# Patient Record
Sex: Female | Born: 1975
Health system: Southern US, Community
[De-identification: ages and names within clinical notes are randomized; demographics above are authoritative.]

## PROBLEM LIST (undated history)

## (undated) ENCOUNTER — Ambulatory Visit: Admission: EM

## (undated) DIAGNOSIS — N736 Female pelvic peritoneal adhesions (postinfective): Secondary | ICD-10-CM

## (undated) DIAGNOSIS — N7011 Chronic salpingitis: Secondary | ICD-10-CM

## (undated) DIAGNOSIS — K754 Autoimmune hepatitis: Secondary | ICD-10-CM

## (undated) DIAGNOSIS — Z8639 Personal history of other endocrine, nutritional and metabolic disease: Secondary | ICD-10-CM

## (undated) DIAGNOSIS — Z8679 Personal history of other diseases of the circulatory system: Secondary | ICD-10-CM

## (undated) DIAGNOSIS — I10 Essential (primary) hypertension: Secondary | ICD-10-CM

## (undated) DIAGNOSIS — N979 Female infertility, unspecified: Secondary | ICD-10-CM

## (undated) DIAGNOSIS — J3089 Other allergic rhinitis: Secondary | ICD-10-CM

## (undated) DIAGNOSIS — H1010 Acute atopic conjunctivitis, unspecified eye: Secondary | ICD-10-CM

## (undated) DIAGNOSIS — J302 Other seasonal allergic rhinitis: Secondary | ICD-10-CM

## (undated) HISTORY — PX: LIVER BIOPSY: SHX301

---

## 2005-06-04 ENCOUNTER — Emergency Department (HOSPITAL_COMMUNITY): Admission: EM | Admit: 2005-06-04 | Discharge: 2005-06-04 | Payer: Self-pay | Admitting: Emergency Medicine

## 2005-06-09 ENCOUNTER — Inpatient Hospital Stay (HOSPITAL_COMMUNITY): Admission: EM | Admit: 2005-06-09 | Discharge: 2005-06-21 | Payer: Self-pay | Admitting: Emergency Medicine

## 2005-06-14 ENCOUNTER — Encounter: Payer: Self-pay | Admitting: Internal Medicine

## 2005-06-14 ENCOUNTER — Encounter (INDEPENDENT_AMBULATORY_CARE_PROVIDER_SITE_OTHER): Payer: Self-pay | Admitting: Specialist

## 2005-07-06 ENCOUNTER — Ambulatory Visit: Payer: Self-pay | Admitting: Family Medicine

## 2005-10-26 ENCOUNTER — Emergency Department (HOSPITAL_COMMUNITY): Admission: EM | Admit: 2005-10-26 | Discharge: 2005-10-26 | Payer: Self-pay | Admitting: Family Medicine

## 2006-08-22 ENCOUNTER — Ambulatory Visit (HOSPITAL_COMMUNITY): Admission: RE | Admit: 2006-08-22 | Discharge: 2006-08-22 | Payer: Self-pay | Admitting: Gastroenterology

## 2006-08-22 ENCOUNTER — Encounter (INDEPENDENT_AMBULATORY_CARE_PROVIDER_SITE_OTHER): Payer: Self-pay | Admitting: Interventional Radiology

## 2009-01-08 HISTORY — PX: ESOPHAGOGASTRODUODENOSCOPY: SHX1529

## 2009-01-14 ENCOUNTER — Encounter: Admission: RE | Admit: 2009-01-14 | Discharge: 2009-01-14 | Payer: Self-pay | Admitting: Gastroenterology

## 2009-01-15 ENCOUNTER — Encounter (HOSPITAL_COMMUNITY): Admission: RE | Admit: 2009-01-15 | Discharge: 2009-02-07 | Payer: Self-pay | Admitting: Gastroenterology

## 2009-05-20 ENCOUNTER — Ambulatory Visit (HOSPITAL_COMMUNITY): Admission: RE | Admit: 2009-05-20 | Discharge: 2009-05-20 | Payer: Self-pay | Admitting: Internal Medicine

## 2009-05-20 HISTORY — PX: OTHER SURGICAL HISTORY: SHX169

## 2010-03-18 ENCOUNTER — Other Ambulatory Visit: Payer: Self-pay | Admitting: Obstetrics & Gynecology

## 2010-04-29 LAB — COMPREHENSIVE METABOLIC PANEL
CO2: 28 mEq/L (ref 19–32)
Calcium: 9.2 mg/dL (ref 8.4–10.5)
Creatinine, Ser: 0.71 mg/dL (ref 0.4–1.2)
Glucose, Bld: 78 mg/dL (ref 70–99)
Potassium: 4 mEq/L (ref 3.5–5.1)
Total Protein: 7.4 g/dL (ref 6.0–8.3)

## 2010-04-29 LAB — CBC
HCT: 38.3 % (ref 36.0–46.0)
Hemoglobin: 12 g/dL (ref 12.0–15.0)
MCHC: 31.4 g/dL (ref 30.0–36.0)
MCV: 69.8 fL — ABNORMAL LOW (ref 78.0–100.0)
Platelets: 439 10*3/uL — ABNORMAL HIGH (ref 150–400)
RBC: 5.49 MIL/uL — ABNORMAL HIGH (ref 3.87–5.11)
RDW: 18.2 % — ABNORMAL HIGH (ref 11.5–15.5)

## 2010-04-29 LAB — PREGNANCY, URINE: Preg Test, Ur: NEGATIVE

## 2010-05-12 LAB — CBC
MCV: 58.1 fL — ABNORMAL LOW (ref 78.0–100.0)
Platelets: 432 10*3/uL — ABNORMAL HIGH (ref 150–400)
RBC: 4.94 MIL/uL (ref 3.87–5.11)

## 2010-05-12 LAB — CROSSMATCH: ABO/RH(D): O POS

## 2010-05-12 LAB — ABO/RH: ABO/RH(D): O POS

## 2010-05-28 ENCOUNTER — Other Ambulatory Visit (HOSPITAL_COMMUNITY): Payer: Self-pay | Admitting: Obstetrics & Gynecology

## 2010-05-28 DIAGNOSIS — N979 Female infertility, unspecified: Secondary | ICD-10-CM

## 2010-06-05 ENCOUNTER — Ambulatory Visit (HOSPITAL_COMMUNITY): Payer: 59

## 2010-06-19 ENCOUNTER — Other Ambulatory Visit: Payer: Self-pay | Admitting: Gastroenterology

## 2010-06-19 DIAGNOSIS — R110 Nausea: Secondary | ICD-10-CM

## 2010-06-24 ENCOUNTER — Ambulatory Visit
Admission: RE | Admit: 2010-06-24 | Discharge: 2010-06-24 | Disposition: A | Discharge status: Home / Self Care | Payer: 59 | Source: Ambulatory Visit | Attending: Gastroenterology | Admitting: Gastroenterology

## 2010-06-24 DIAGNOSIS — R110 Nausea: Secondary | ICD-10-CM

## 2010-06-26 NOTE — Discharge Summary (Signed)
Katie Andrade, Katie Andrade               ACCOUNT NO.:  [id number]   MEDICAL RECORD NO.:  [id number]          PATIENT TYPE:  INP   LOCATION:  1430                         FACILITY:  [facility]   PHYSICIAN:  Hollice Espy, M.D.DATE OF BIRTH:  Nov 02, 1975   DATE OF ADMISSION:  06/09/2005  DATE OF DISCHARGE:  06/14/2005                                 DISCHARGE SUMMARY   PRIMARY CARE PHYSICIAN:  None.   CONSULTATIONS:  Bernette Redbird, M.D., [facility].   DISCHARGE DIAGNOSES:  1.  Acute liver failure, etiology unknown and workup in progress.  2.  History of Hashimoto's thyroiditis and now secondary hypothyroidism.  3.  Obesity.   DISCHARGE MEDICATIONS:  The patient is not on any outpatient medications at  the time of admission.  Currently, she is on:  1.  Dulcolax 5 mg p.o. daily p.r.n.  2.  2 mg IV Zofran q.6 hours p.r.n. adjusted for her hepatic failure.  3.  Ambien 2.5 mg p.o. q.h.s. p.r.n.   HOSPITAL COURSE:  The patient is a very pleasant 35 year old African-  American female with past medical history only of Hashimoto's' thyroiditis  in the past as well as obesity who presented to the emergency room with  several days of nausea and vomiting.  In addition, she was noted at time of  admission that her sclerae were icteric.  She had no other symptoms and has  never once had any episodes of abdominal pain.  On admission, the patient  had lab work done which noted a UA with a large amount of bilirubin, trace  ketones, essentially normal BMET with a slightly low potassium accounting  for her vomiting, a normal white count with no shift, but most concerning  was her hepatic function panel which noted an AST of 4154, ALT 3042, and a  total bilirubin of 16.2.  The patient was admitted for hepatic failure and  planned for workup.  The patient was also noted to have a mild UTI and she  received 3 days of IV Cipro which she has completed.  The patient had a  hepatitis panel ordered  which showed negative for hepatitis B surface  antigen and core antigen both of which were negative as well as negative  antibiotics with hepatitis C and hepatitis A IgM.  On hospital day #2, the  patient had an INR checked and found to be elevated at 1.6.  One was not  done on admission, however, concerning were her liver function tests which  showed her bilirubin had increased to 13.7, AST and ALT however had  decreased.  At this point, [facility], Dr. Matthias Hughs was consulted  to evaluate the patient for cause of this hepatic failure.  Serum  ceruloplasmin to rule out Wilson's disease as well as iron studies to rule  out hemachromatosis were ordered as well.  Dr. Matthias Hughs met with the patient  and felt the patient had acute hepatitis of unclear cause.  He felt this  possibly could be infectious etiology, although, known pathogens were  screened for and were negative.  Toxic ingestion although there was no  history.  The patient had a Tylenol level as well as urine drug screen  ordered both of which were negative.  The patient is an excellent historian  and gave no history of any recent Tylenol use.  The patient's ceruloplasmin,  although, came back negative consistent with Wilson's disease.  She had her  ferritin level which returned back elevated at 1100, but with normal serum  iron and TIBC seen to rule out hemachromatosis as well.  In addition, the  patient's total protein and albumin levels were within normal range at 5.5  and 2.5 respectively ruling out any type of significant  hypergammaglobulinemia.  Dr. Matthias Hughs felt that with the iron and iron  binding capacity tests being negative, the patient's ferritin level may be  increased secondary to her acute presentation and not necessarily from iron  deposition.  Dr. Matthias Hughs discussed the patient with Dr. Piedad Climes, a  hepatologist at [facility] in terms of a discussion about possible liver  transplant and transfer to [facility].  Dr. Piedad Climes felt they would be  happy to take the patient in transfer, although, they felt at this time the  patient was stable.  She was alert and oriented.  She showed no signs of  hepatic encephalopathy and recommended continuing to follow the patient to  ensure that her liver function tests continued to improve.  In addition,  recommending checking an ANA, smooth muscle antibody, and serum IgG.  Over  the next several days, the patient was continued to be followed.  Her  transaminases continued to trend down.  Attempts were made for a CT of the  abdomen and pelvis.  The patient had a right upper quadrant ultrasound which  was completely unremarkable on admission.  She also had been complaining  initially of some mild dysphagia which was eventually felt to be just  secondary to some gagging from her initial nausea and vomiting.  A barium  swallow evaluation was done on hospital day #2 which was completely normal.  However, the patient had some difficulty moving her bowels and her barium  was retained which prevented a full CT of the abdomen and pelvis study.  She  was finally able to clear this barium and had the CT of the abdomen and  pelvis done on the afternoon of Jun 13, 2005.  This showed a completely  unremarkable liver as well as pancreas, gallbladder, adrenal, and kidneys.  The spleen was noted to be on the upper limits of normal without any focal  splenic lesion.  No evidence of any biliary dilatation, free fluid, or  abdominal aortic aneurysm was noted as well.  The patient was noted to have  some few mildly enlarged mesenteric lymph nodes which were more likely felt  to be reactive than anything else.  A CT of her pelvis was completely  unremarkable.  The patient continued to have labs checked including a daily  liver function tests and PT/INR.  Her INR, however, has continued to trend up and is now 2.1.  Her transaminases initially were trending down, however,  on Jun 14, 2005, today her transaminases have slightly elevated to an AST of  2881, ALT of 1745, when compared to day prior which were 2331 and 1738.  Her  bilirubin has continued to trend up and is now 20.5.  An ammonia level was  checked and was found to be elevated at 37, minimal elevation, but compared  to one done on hospital day #2, is  slightly now within normal range.  In  addition, lipase level was checked initially and it was found to be negative  as well.  Shirley Friar, M.D., who works with Dr. Matthias Hughs for [facility]  [facility], followed the patient and recommended following a negative  CT scan, a liver biopsy.  In discussion with interventional radiology,  despite the elevated INR, the plans were by interventional radiology to  still do the liver biopsy given the worsening status of the patient's liver  and the need for diagnosis.  The patient is currently undergoing her liver  biopsy today, Jun 14, 2005, this morning.  Results are pending.  In addition,  given her worsening liver enzyme studies, the patient has been followed up  by GI and discussed at [facility].  [facility] has agreed to accept the  patient and the plan is for the patient to be transferred over to [facility] for further liver study workup and possibly for consideration for  transplantation.  At this time, the etiology of the patient's liver failure  is unknown, although, possibilities were fatty liver infiltration, however,  no significant fat infiltrates are seen on CT.  The other concern would be  given the patient's previous history of Hashimoto's thyroiditis, that this  is a possible autoimmune etiology.  At this time, smooth muscle antibody and  ANA are still pending.      Hollice Espy, M.D.  Electronically Signed     SKK/MEDQ  D:  06/14/2005  T:  06/14/2005  Job:  [id number]   cc:   Bernette Redbird, M.D.  Fax: [phone]

## 2010-06-26 NOTE — Discharge Summary (Signed)
Katie, Andrade NO.:  [id number]   MEDICAL RECORD NO.:  [id number]          PATIENT TYPE:  INP   LOCATION:  1402                         FACILITY:  [facility]   PHYSICIAN:  Deirdre Peer. Polite, M.D. DATE OF BIRTH:  Jan 21, 1976   DATE OF ADMISSION:  06/09/2005  DATE OF DISCHARGE:  06/21/2005                                 DISCHARGE SUMMARY   ADDENDUM   DISCHARGE DIAGNOSES:  1.  Hepatitis felt secondary to be autoimmune hepatitis with improving liver      function tests with AST 255, ALT 466 at time of discharge.      International normalized ratio 1.3 being treated with prednisone until      outpatient follow up with Dr. Shirley Friar, [facility]      [facility]astro[facility]ology.  2.  Steroid-induced hyperglycemia discharged on Amaryl 2 mg daily.  Surgical      pathology report showed a pattern of severe, chronic, active hepatitis      with necrosis most compatible with acute autoimmune hepatitis.   DISPOSITION:  Discharged to home.   FOLLOW UP:  Follow up with Dr. Shirley Friar, gastroenterology.   HISTORY OF PRESENT ILLNESS:  Please see admission H&P and discharge summary  by Dr. [name] and Dr. Rito Ehrlich.  In summary, the patient presented to the  hospital with jaundice and was admitted for further evaluation.  Please see  complete discharge summary by Dr. Rito Ehrlich for further details.   HOSPITAL COURSE:  The patient was admitted for jaundice.  The patient had  significant evaluation which included hepatitis panel, CAT scan as well as  biopsy.  Infectious hepatitis studies were negative.  The patient's surgical  pathology was consistent with autoimmune hepatitis.  From May 8 to May 14,  the patient was essentially hospitalized while awaiting pathology report and  initiating appropriate treatment for autoimmune hepatitis which includes  prednisone and ultimately azathioprine.  The patient's LFTs trended down as  stated above and ultimately has been cleared for  discharge for further  outpatient followup with [facility].  During her hospitalization, initially it  was felt that she may require transfer to [facility], however, this was not  deemed necessary.   The patient had elevated CBGs which were steroid-induced.  The patient will  be discharged on Amaryl 2 mg and will be provided with prescription for  Glucometer, test strips and asked to check blood sugars two times a day.   Her right upper extremity swelling resolved.  Please note that the patient  had ultrasound of her right arm negative for DVT.   The patient had blurred vision during this hospitalization.  Apparently,  improving and more than likely multifactorial.  The patient to follow up  with ophthalmologist on an outpatient basis.   At this time, the patient is felt to be stable for discharge to home for  further outpatient followup.      Deirdre Peer. Polite, M.D.  Electronically Signed     RDP/MEDQ  D:  06/21/2005  T:  06/21/2005  Job:  [id number]

## 2010-06-26 NOTE — H&P (Signed)
NAMEMYESHIA, FOJTIK NO.:  [id number]   MEDICAL RECORD NO.:  [id number]          PATIENT TYPE:  INP   LOCATION:  1430                         FACILITY:  [facility]   PHYSICIAN:  Sherin Quarry, MD      DATE OF BIRTH:  Jun 03, 1975   DATE OF ADMISSION:  06/09/2005  DATE OF DISCHARGE:                                HISTORY & PHYSICAL   Katie Andrade is a very pleasant 35 year old lady who has generally been in  good health.  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  No one else at work has been sick, except that two weeks ago  she recalls that one of her coworkers was out of work briefly with a stomach  virus.  Tuesday, one  week ago, the patient states that she began to feel  sick.  Her boyfriend tried to fix her some chicken and mashed potatoes and  she became nauseated and could not eat it.  On Wednesday, she said she felt  jittery and tired and continued to be nauseated with very little appetite.  Later in the day, she began to experience persistent vomiting.  Vomiting  continued from Wednesday until Friday and was associated with mild diarrhea.  Eventually, she presented to the emergency room at [facility] n  Friday evening, and there she was given an injection of Phenergan which  seemed to stop vomiting, but made her extremely tired.  She says that she  basically slept all day Saturday.  Since Sunday, she has had no vomiting,  but she has continued to feel weak, listless, and has very little appetite.  Nonetheless, she went back to work and today one of her coworkers pointed  out that her eyes looked yellow.  She therefore returned to the [facility]  emergency room where on presentation her temperature was 99.1, pulse 97,  respirations 16.  Laboratory studies obtained were remarkable for a white  count of 7,700.  Her SGOT was 4,154, SGPT 3,042,  alkaline phosphatase 138,  total bilirubin was 16.2.  Her sodium was 134, potassium 3.1, glucose 87,  creatinine 0.8.  Her urinalysis was notable for moderate pyuria.  Over the  last 24 hours, the patient states that she is not really experiencing  vomiting, but she has an unusual sensation that when she eats food, it does  not seem to go all the way down.  She has had a dull headache.  She has not  experienced any rash.  There has been no breathing difficulty, no chest  pain.  There has been no abdominal pain.  She has noted that her bowel  movements were somewhat clay-colored and her urine looks dark orange.  She  has not had any easy bleeding or bruising.  She is admitted at this time for  evaluation of what is apparently a hepatitis.   PAST MEDICAL HISTORY:  She takes no medications.  She states that she will  occasionally take a Sudafed  PE for her allergies.  She specifically denies  taking any nonsteroidal anti-inflammatory drugs, any other over-the-counter  medications, or any herbal remedies.   She has no known drug allergies.   HOSPITALIZATIONS:  None.   OPERATIONS:  The only operation she can recall was a previous wisdom tooth  extraction.   MEDICAL ILLNESSES:  She says that in 1996 she was diagnosed with hypoactive  thyroid.  She apparently took thyroid medication for a brief period,  however, she discontinued this practice and since 1999 on a yearly basis she  has had a physical exam at [facility] and has had her thyroid  function checked.  She says that they have always been normal.  She cannot  recall any other significant past medical history.   FAMILY HISTORY:  She really does not know about her father.  Her mother is  in good health.  She is an only child and does not have any children.   SOCIAL HISTORY:  She will drink alcohol about once per month, perhaps one or  two alcoholic beverages.  She does not use any drugs.  She is sexually  active with  one significant boyfriend.  She has not had any travel history  recently and there does not seem any other significant exposure history.   REVIEW OF SYSTEMS:  HEAD:  She reports a dull throbbing headache.  EAR/NOSE/THROAT:  Denies earache, sinus pain or sore throat.  CHEST:  Denies  coughing, wheezing, or chest congestion.  CARDIOVASCULAR:  Denies orthopnea,  PND, or ankle edema.  GI:  See above.  GU: See above.  Note, that there has  been no dysuria.  GYN:  She has had regular menstrual periods.  There has  been no unusual bleeding or discharge.  NEURO:  There is no history of  seizure or stroke.  ENDO:  She denies excessive thirst, urinary frequency,  or nocturia.  Otherwise see above.   PHYSICAL EXAMINATION:  HEENT:  Remarkable for marked scleral icterus.  Tympanic membranes were clear.  Nares is patent.  Pharynx shows some icteric  changes in the mucous membranes.  CHEST:  Clear.  BACK:  Reveals no CVA or point tenderness.  CARDIOVASCULAR:  Reveals normal S1 and S2 without gross murmurs or gallops.  ABDOMEN:  Benign.  There are normal bowel sounds.  There is no masses.  There is a dull ache elicited with the deep palpation in the right upper  quadrant area along the liver margin.  The liver is not palpably enlarged.  NEUROLOGIC:  Testing is within normal limits.  EXTREMITIES:  Reveals no petechiae, no unusual bruising, no rashes.   IMPRESSION:  1.  Fever, anorexia, jaundice, and markedly abnormal liver functions      presumed hepatitis.  2.  Dysphagia of uncertain etiology.  3.  History of hypothyroidism.  4.  History of seasonal allergies.  5.  Pyuria.   PLAN:  The patient is probably relatively dehydrated and we will administer  intravenous fluids.  We will give medication for nausea.  Hepatitis profile  will be obtained to evaluate the possibility of hepatitis A , B, or C.  Although probably her symptoms are secondary to hepatitis, we will do a simple swallowing evaluation  just to be sure that there is no other problem.  Also I am going to image her liver with an abdominal ultrasound.  In light  of her pyuria, a urine culture will be obtained.  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           ______________________________  Sherin Quarry, MD     SY/MEDQ  D:  06/09/2005  T:  06/09/2005  Job:  [id number]   cc:   [facility]

## 2010-06-26 NOTE — Consult Note (Signed)
[name], [name] NO.:  [id number]   MEDICAL RECORD NO.:  [id number]          PATIENT TYPE:  INP   LOCATION:  1430                         FACILITY:  [facility]   PHYSICIAN:  Bernette Redbird, M.D.   DATE OF BIRTH:  12/13/1975   DATE OF CONSULTATION:  06/11/2005  DATE OF DISCHARGE:                                   CONSULTATION   CONSULTING PHYSICIAN:  Bernette Redbird, M.D.   GASTROENTEROLOGY CONSULTATION.:  Dr. Virginia Rochester of the [facility]  hospitalists asked [place] to see this 35 year old African-American female  because of acute hepatitis.  She is an unassigned patient.   Katie Andrade was in good health until about 10 days ago when she began to feel  nauseated.  Over the next several days, she had some vomiting and ultimately  turned jaundiced and was admitted to the hospital two days ago.  At this  time, she feels reasonably well on a clear liquid diet, but perhaps, just a  little bit of abdominal cramping, nothing impressive.   The patient's evaluation in the hospital, has shown marked abnormalities of  liver chemistries, in a somewhat fluctuating pattern, with no clear  transient toward either worsening nor improvement.  She has not been  encephalopathic or hypoglycemic.  Her INR is only minimally elevated.  Her  ultrasound shows no ascites, a normal size liver, a normal biliary system,  and no evidence of focal hepatic parenchymal abnormalities.   LABORATORY:  Are as follows:  BUN has gone from 3 to less than 1.  Alkaline  phosphatase from 138 to 102.  Bilirubin from 16 down to 13.7 and then today  up to 14.9.  AST from approximately 4100 to 3200 to 4800 today.  ALT from  3000 to 2500 to 2100.  And, albumin from 3.6 to 2.5.  Her lipase and TSH  levels are normal.   In terms of causation of this clinical presentation, hepatitis serologies  have been negative including hepatitis B surface antigen, hepatitis B core  antibody IgM, hepatitis A antibody IgM, and  hepatitis C antibody.   The patient's INR is minimally elevated at 1.6, with a pro time of 18.8.  Hemoglobin is 13, white count 7700 with 46 polys, 33 lymphs, and 18 monos,  and platelet count is normal at 367,000.   No obvious cause for the patient's hepatitis is evident.  In particular,  there is no history of recent ingestion of Tylenol.  Six weeks ago the  patient took one dose of phentermine (an over-the-counter diet supplement),  three weeks ago she had some Goody Powders.  She has occasional green tea.  She uses Aleve during her menses but more recently has used Midol.  She has  used some Eckerd brand Sudafed PE, some antidiarrheal medicine and some  Pepto-Bismol.  There is no history of exposure to herbal remedies.   Clinically, the patient has not had, to my knowledge, skin rashes,  arthralgias, fevers, chills, nor any prodrome of anorexia or weight loss.   PAST MEDICAL HISTORY:  No known allergies.   OUTPATIENT MEDICATIONS:  None on any  regular chronic basis.  Please see  above.   HABITS:  Nondrinker.  That is to say, she might have alcohol once or twice a  month.  I believe she is a nonsmoker.  She does not use street drugs.   FAMILY HISTORY:  Negative for liver disease.   SOCIAL HISTORY:  The patient is engaged to be married and her fiance, Katie Andrade,  is at the bedside.  She works at [employer], I think both in [employer] part  and in the office.  No one else around her has had problems with jaundice.   REVIEW OF SYSTEMS:  See HPI.   PHYSICAL EXAMINATION:  GENERAL:  Negative for stigmata of chronic liver  disease.  This is a jaundiced African-American female who is chipper, alert,  appropriate, and certainly in no distress.  There is no palmar erythema.  CHEST/HEART:  Unremarkable.  ABDOMEN:  By scratch test, I think the liver span is normal, although she is  somewhat overweight, I do not appreciate any hepatosplenomegaly to exam.  There is no obvious ascites, no  significant abdominal tenderness.  MENTAL STATUS:  Normal.  She can do serial threes quickly and accurately,  has a normal affect, laughs easily, is alert and appropriate.   IMPRESSION:  Acute hepatitis of unclear cause.   The differential diagnosis would include infectious etiologies, although  known pathogens have been screened for and are negative, some sort of toxic  ingestion or medication side effect (no historical clues to support that),  or conceivably a metabolic disturbance such as Wilson's disease.  Another  thought would be autoimmune hepatitis, although while not mentioned above,  the patient's total protein and albumin are both on the low side,  specifically, total protein 5.5 and albumin 2.5, arguing against the  presence of any significant hypergammaglobulinemia.  Note, that drug screen  is negative and acetaminophen level obtained two days following admission is  undetectable.  Ferritin level is markedly increased at 1,010, although the  clinical significance of this is uncertain given the patient's acute  hepatitis.  Iron and iron-binding capacity tests are negative.   RECOMMENDATIONS:  I have discussed the case with Dr. Woodfin Ganja, a  hepatologist at [facility].  They would be happy to consider taking the  patient in transfer, although at this time she felt that unless the patient  is clearly worsening, the continued observation here is reasonable.  She did  recommend checking for autoimmune hepatitis with antinuclear antibody,  smooth muscle antibody, and serum IgG.   I think that the most important factor from here will be checking daily  prothrombin times, following the liver chemistries, and most importantly,  the patient's mental status.  That should help drive the decision as to  whether or not to transfer the patient, for example, if it appears she is  starting to move toward hepatic failure.  Fortunately, so far, the evidence does not indicate that but I  did speak with her fiance and her that  deterioration in her condition would be grounds for transfer.   We appreciate the opportunity to have seen this patient in consultation with  you.           ______________________________  Bernette Redbird, M.D.     RB/MEDQ  D:  06/11/2005  T:  06/12/2005  Job:  [id number]

## 2010-11-24 LAB — CBC
HCT: 34.4 — ABNORMAL LOW
Hemoglobin: 11 — ABNORMAL LOW
MCV: 64.7 — ABNORMAL LOW
Platelets: 399
RBC: 5.32 — ABNORMAL HIGH
RDW: 19.2 — ABNORMAL HIGH

## 2010-11-24 LAB — PROTIME-INR: Prothrombin Time: 13.7

## 2013-05-01 ENCOUNTER — Other Ambulatory Visit: Payer: Self-pay | Admitting: Family

## 2013-05-01 ENCOUNTER — Ambulatory Visit: Admission: RE | Admit: 2013-05-01 | Payer: 59 | Source: Ambulatory Visit

## 2013-05-01 ENCOUNTER — Ambulatory Visit
Admission: RE | Admit: 2013-05-01 | Discharge: 2013-05-01 | Disposition: A | Discharge status: Home / Self Care | Payer: 59 | Source: Ambulatory Visit | Attending: Family | Admitting: Family

## 2013-05-01 ENCOUNTER — Other Ambulatory Visit: Payer: Self-pay | Admitting: Internal Medicine

## 2013-05-01 DIAGNOSIS — T1490XA Injury, unspecified, initial encounter: Secondary | ICD-10-CM

## 2016-09-24 ENCOUNTER — Encounter (HOSPITAL_BASED_OUTPATIENT_CLINIC_OR_DEPARTMENT_OTHER): Payer: Self-pay | Admitting: *Deleted

## 2016-10-05 ENCOUNTER — Ambulatory Visit (HOSPITAL_BASED_OUTPATIENT_CLINIC_OR_DEPARTMENT_OTHER): Admission: RE | Admit: 2016-10-05 | Payer: 59 | Source: Ambulatory Visit | Admitting: Obstetrics and Gynecology

## 2016-10-05 ENCOUNTER — Encounter (HOSPITAL_BASED_OUTPATIENT_CLINIC_OR_DEPARTMENT_OTHER): Admission: RE | Payer: Self-pay | Source: Ambulatory Visit

## 2016-10-05 HISTORY — DX: Essential (primary) hypertension: I10

## 2016-10-05 HISTORY — DX: Female infertility, unspecified: N97.9

## 2016-10-05 HISTORY — DX: Autoimmune hepatitis: K75.4

## 2016-10-05 HISTORY — DX: Chronic salpingitis: N70.11

## 2016-10-05 HISTORY — DX: Personal history of other endocrine, nutritional and metabolic disease: Z86.39

## 2016-10-05 SURGERY — LAPAROSCOPY OPERATIVE
Anesthesia: General | Laterality: Left

## 2017-01-06 ENCOUNTER — Encounter: Payer: Self-pay | Admitting: Allergy

## 2017-01-06 ENCOUNTER — Ambulatory Visit (INDEPENDENT_AMBULATORY_CARE_PROVIDER_SITE_OTHER): Payer: 59 | Admitting: Allergy

## 2017-01-06 VITALS — BP 124/78 | HR 85 | Temp 98.1°F | Resp 17 | Ht 65.0 in | Wt 283.4 lb

## 2017-01-06 DIAGNOSIS — J302 Other seasonal allergic rhinitis: Secondary | ICD-10-CM

## 2017-01-06 DIAGNOSIS — J3089 Other allergic rhinitis: Secondary | ICD-10-CM | POA: Diagnosis not present

## 2017-01-06 DIAGNOSIS — H1010 Acute atopic conjunctivitis, unspecified eye: Secondary | ICD-10-CM | POA: Insufficient documentation

## 2017-01-06 MED ORDER — MONTELUKAST SODIUM 10 MG PO TABS: 10.0000 mg | ORAL_TABLET | Freq: Every day | ORAL | 5 refills | Status: AC

## 2017-01-06 MED ORDER — LEVOCETIRIZINE DIHYDROCHLORIDE 5 MG PO TABS: 5.0000 mg | ORAL_TABLET | Freq: Every evening | ORAL | 5 refills | Status: AC

## 2017-01-06 MED ORDER — OLOPATADINE HCL 0.2 % OP SOLN: 1.0000 [drp] | OPHTHALMIC | 5 refills | Status: DC

## 2017-01-06 MED ORDER — AZELASTINE HCL 0.1 % NA SOLN: 2.0000 | Freq: Two times a day (BID) | NASAL | 5 refills | Status: AC

## 2017-01-06 NOTE — Patient Instructions (Addendum)
1. Seasonal and perennial allergic rhinitis - For nasal congestion and drainage provided with sample of Dymista today to use 1 spray each nostril twice a day.  Hold flonase while using Dymista.  Will prescribe Astelin nasal antihistamine spray to use in combination with Flonase once Dymista sample runs out.  - Begin taking Xyzal 5 mg one time a day as needed for a runny nose - Begin taking montelukast 10 mg a day as needed for nasal symptoms - Allergen avoidance measures provided for pollens and cockroach   2. Seasonal allergic conjunctivitis - Begin olopatadine eye drops as needed for red, watery, itchy eyes  If no improvement in symptoms is noted we advised allergy immunotherapy.  We discussed benefits, risk as well as protocol of immunotherapy today and will provide with information packet.  She will call to let us know if she would like to start this therapy and will check with her insurance coverage.     3. Follow up in 4 months or earlier as needed

## 2017-01-06 NOTE — Progress Notes (Addendum)
Katie Andrade [id number] Dept: [phone]  FAMILY NURSE PRACTITIONER NEW PATIENT NOTE  Patient ID: Katie Andrade, female    DOB: October 31, 1975  Age: 41 y.o. MRN: [id number] Date of Office Visit: 01/06/2017 Referring provider: Helane Rima, MD [facility] [place], [place] [id number]    Chief Complaint: Allergic Rhinitis   HPI Katie Andrade 41 year old female who presents to the clinic today for evaluation of allergies.  She reports her symptoms began in the fall of 1997 when she moved from [place] to [place], [place].  Symptoms include itchy watery eyes a couple of times a month, nasal congestion, sinus infections, and occasional epistaxis.  She reports her eyes feel puffy, itchy, and watery a couple of times a month and the symptoms are relieved with over-the-counter allergy eyedrops.  She suffers from nasal congestion as well has a runny nose which is reported as all year round.  She frequently has thick nasal drainage and a headache over her eyes.  She is currently using fluticasone nasal spray 2 times a day which is helping with her nasal congestion.  She has previously tried Aleve sinus, Aleve headache, Claritin-D and Katie Andrade.  She had 3 sinus infections in the last year each requiring an antibiotic and one round of prednisone.  She reports that she had allergy testing in 1998 and was found to have sensitivity to pet dander and mold.  She does not have a history of asthma nor does she have any shortness of breath, cough, or wheezing.  Her daily activities are not impacted by shortness of breath wheezing or cough.  Her skin is reported as dry which is worse in the winter.  Her moisturizing routine consists of Katie Andrade products, shea butter, Keri lotion, or Lubriderm.  She has reported a dry patch of skin above her left eyebrow.  She has no history of eczema.  Family history includes allergies, eczema, and asthma in her mother  and several cousins.  Katie Andrade is not concerned food allergies.  She reports eating a varied diet without issues except for eggplant which makes her feel nauseated.  Her current medications include labetalol 50 mg twice a day, fluticasone nasal spray as needed, hydrochlorothiazide 25 mg once a day, and azathioprin 75 mg once a day.  Review of Systems  Constitutional: Negative.   HENT:       Nasal congestion and runny nose reported year round.  Eyes:       Itchy, watery eyes a couple times a month.  Respiratory: Negative.   Cardiovascular: Negative.   Gastrointestinal: Negative.   Genitourinary: Negative.   Musculoskeletal: Negative.   Skin:       Reports a small dry patch over her left eye that appears intermittently  Neurological: Negative.   Endo/Heme/Allergies: Negative.   Psychiatric/Behavioral: Negative.     Outpatient Encounter Medications as of 01/06/2017  Medication Sig  . aspirin-acetaminophen-caffeine (EXCEDRIN MIGRAINE) 124-580-99 MG tablet Take by mouth every 6 (six) hours as needed for headache.  Katie Andrade Kitchen azathioprine (IMURAN) 100 MG tablet Take 100 mg by mouth 2 (two) times daily. 100mg  in am and 50mg  in pm  . fluticasone (FLONASE) 50 MCG/ACT nasal spray Place into the nose.  . hydrochlorothiazide (HYDRODIURIL) 25 MG tablet Take 25 mg by mouth daily.  Katie Andrade Kitchen labetalol (NORMODYNE) 100 MG tablet labetalol 100 mg tablet  . Multiple Vitamins-Minerals (MULTIVITAMIN ADULT EXTRA C PO) multivitamin  . azelastine (ASTELIN) 0.1 % nasal spray Place 2 sprays  into both nostrils 2 (two) times daily.  Katie Andrade Kitchen levocetirizine (XYZAL) 5 MG tablet Take 1 tablet (5 mg total) by mouth every evening.  . montelukast (SINGULAIR) 10 MG tablet Take 1 tablet (10 mg total) by mouth at bedtime.  . Olopatadine HCl (PATADAY) 0.2 % SOLN Place 1 drop into both eyes 1 day or 1 dose.  . [DISCONTINUED] lisinopril (PRINIVIL,ZESTRIL) 5 MG tablet Take 5 mg by mouth daily.   No facility-administered encounter medications on  file as of 01/06/2017.      Drug Allergies:  No Known Allergies   Environmental History: She lives in a 45-year-old house with no concern for water damage or mildew.  The flooring is carpeting throughout.  Heating is gas and cooling is central.  There are no animals located inside the home.  There are no dust covers in use on the bed mattress or pillows.  There is no concern for tobacco smoke, fumes, chemicals, or dust in the home.   Family History: Katie Andrade's family history includes Alcohol abuse in her father; Allergic rhinitis in her mother; Asthma in her mother; Breast cancer in her maternal grandmother; Diabetes in her maternal grandmother; Eczema in her mother..  Physical Exam: BP 124/78 (BP Location: Left Arm, Patient Position: Sitting)   Pulse 85   Temp 98.1 F (36.7 C)   Resp 17   Ht 5\' 5"  (1.651 m)   Wt 283 lb 6.4 oz (128.5 kg)   SpO2 97%   BMI 47.16 kg/m    Physical Exam  Constitutional: She is oriented to person, place, and time. She appears well-developed and well-nourished.  HENT:  Head: Normocephalic and atraumatic.  Right Ear: External ear normal.  Left Ear: External ear normal.  Nose: Nose normal.  Mouth/Throat: Oropharynx is clear and moist.  Eyes: Conjunctivae are normal.  Neck: Normal range of motion. Neck supple.  Cardiovascular: Normal rate, regular rhythm and normal heart sounds.  S1-S2 normal.  Heart rate and rhythm regular  Pulmonary/Chest: Effort normal and breath sounds normal.  Lung sounds clear to auscultation  Abdominal: Soft. Bowel sounds are normal.  Musculoskeletal: Normal range of motion.  Neurological: She is alert and oriented to person, place, and time.  Skin: Skin is warm and dry.  Psychiatric: She has a normal mood and affect. Her behavior is normal. Judgment and thought content normal.    Diagnostics: Negative to full adult percutaneous skin prick environmental panel.  Positive intradermal testing to [place], [place], grass mix,  ragweed, weed mix, tree mix, and cockroach.  Negative intradermal testing to all molds, cat, and dog.    Assessment  Assessment and Plan: 1. Seasonal and perennial allergic rhinitis   2. Seasonal allergic conjunctivitis     Meds ordered this encounter  Medications  . montelukast (SINGULAIR) 10 MG tablet    Sig: Take 1 tablet (10 mg total) by mouth at bedtime.    Dispense:  30 tablet    Refill:  5  . levocetirizine (XYZAL) 5 MG tablet    Sig: Take 1 tablet (5 mg total) by mouth every evening.    Dispense:  30 tablet    Refill:  5  . azelastine (ASTELIN) 0.1 % nasal spray    Sig: Place 2 sprays into both nostrils 2 (two) times daily.    Dispense:  30 mL    Refill:  5  . Olopatadine HCl (PATADAY) 0.2 % SOLN    Sig: Place 1 drop into both eyes 1 day or 1 dose.  Dispense:  1 Bottle    Refill:  5    1. Seasonal and perennial allergic rhinitis - For nasal congestion and drainage provided with sample of Dymista today to use 1 spray each nostril twice a day.  Hold flonase while using Dymista.  Will prescribe Astelin nasal antihistamine spray to use in combination with Flonase once Dymista sample runs out.  - Begin taking Xyzal 5 mg one time a day as needed for a runny nose - Begin taking montelukast 10 mg a day as needed for nasal symptoms - Allergen avoidance measures provided for pollens and cockroach  2. Seasonal allergic conjunctivitis - Begin olopatadine eye drops as needed for red, watery, itchy eyes  3. Follow up in 4 months or earlier as needed  If no improvement in symptoms is noted we advised allergy immunotherapy.  We discussed benefits, risk as well as protocol of immunotherapy today and will provide with information packet.  She will call to let us know if she would like to start this therapy and will check with her insurance coverage.  She will need ID or serum testing for dust mites prior to beginning allergen immunotherapy.    Thank you for the opportunity to care  for this patient.  Please do not hesitate to contact me with questions.  Gareth Morgan, FNP [facility] [place]: I performed/discussed the history and physical examination of the patient as well as management with NP Daiki Dicostanzo. I reviewed the NP's note and agree with the documented findings and plan of care with following additions/exceptions:  Prudy Feeler, MD [facility]

## 2017-01-10 ENCOUNTER — Telehealth: Payer: Self-pay

## 2017-01-10 NOTE — Telephone Encounter (Signed)
Patient's insurance does not cover Pataday 0.2%. The alternatives are ketotifen, generic Optivar, generic Lastacaft.    Please advise and thank you.

## 2017-01-11 MED ORDER — KETOTIFEN FUMARATE 0.025 % OP SOLN: 1.0000 [drp] | Freq: Two times a day (BID) | OPHTHALMIC | 5 refills | Status: DC

## 2017-01-11 NOTE — Addendum Note (Signed)
Addended by: Lucrezia Starch I on: 01/11/2017 04:26 PM   Modules accepted: Orders

## 2017-01-11 NOTE — Telephone Encounter (Signed)
Ketotifen works 1 drop each eye as needed up to twice a day

## 2017-04-21 DIAGNOSIS — J329 Chronic sinusitis, unspecified: Secondary | ICD-10-CM | POA: Diagnosis not present

## 2017-05-12 DIAGNOSIS — Z0000 Encounter for general adult medical examination without abnormal findings: Secondary | ICD-10-CM | POA: Diagnosis not present

## 2017-05-12 DIAGNOSIS — K754 Autoimmune hepatitis: Secondary | ICD-10-CM | POA: Diagnosis not present

## 2017-05-12 DIAGNOSIS — Z6841 Body Mass Index (BMI) 40.0 and over, adult: Secondary | ICD-10-CM | POA: Diagnosis not present

## 2017-05-23 ENCOUNTER — Other Ambulatory Visit: Payer: Self-pay

## 2017-05-23 ENCOUNTER — Encounter (HOSPITAL_BASED_OUTPATIENT_CLINIC_OR_DEPARTMENT_OTHER): Payer: Self-pay | Admitting: *Deleted

## 2017-05-23 NOTE — Progress Notes (Addendum)
SPOKE W/ PT VIA PHONE FOR PRE-OP INTERVIEW.  NPO AFTER MN.  ARRIVE AT 0800.  NEEDS URINE PREG.  GETTING CBC,CMET, T&S ON Tuesday 05-24-2017 @1400 .  WILL TAKE IMURAN AND DO NASAL SPRAYS AS USUAL.    ADDENDUM:  REQUESTED AND RECEIVED VIA FAX PT LOV NOTE FROM DR Michail Sermon (PT'S GI FROM [facility]). PUT IN CHART.

## 2017-05-24 ENCOUNTER — Encounter (HOSPITAL_COMMUNITY)
Admission: RE | Admit: 2017-05-24 | Discharge: 2017-05-24 | Disposition: A | Discharge status: Home / Self Care | Payer: 59 | Source: Ambulatory Visit | Attending: Obstetrics and Gynecology | Admitting: Obstetrics and Gynecology

## 2017-05-24 ENCOUNTER — Encounter (HOSPITAL_BASED_OUTPATIENT_CLINIC_OR_DEPARTMENT_OTHER): Payer: Self-pay | Admitting: *Deleted

## 2017-05-24 DIAGNOSIS — Z01812 Encounter for preprocedural laboratory examination: Secondary | ICD-10-CM | POA: Insufficient documentation

## 2017-05-24 LAB — CBC
HCT: 33.4 % — ABNORMAL LOW (ref 36.0–46.0)
HEMOGLOBIN: 10.3 g/dL — AB (ref 12.0–15.0)
MCH: 20.4 pg — AB (ref 26.0–34.0)
MCHC: 30.8 g/dL (ref 30.0–36.0)
MCV: 66.1 fL — ABNORMAL LOW (ref 78.0–100.0)
PLATELETS: 602 10*3/uL — AB (ref 150–400)
RBC: 5.05 MIL/uL (ref 3.87–5.11)
RDW: 18.5 % — ABNORMAL HIGH (ref 11.5–15.5)
WBC: 7.5 10*3/uL (ref 4.0–10.5)

## 2017-05-24 LAB — COMPREHENSIVE METABOLIC PANEL
ALT: 87 U/L — AB (ref 14–54)
ANION GAP: 8 (ref 5–15)
AST: 139 U/L — ABNORMAL HIGH (ref 15–41)
Albumin: 3.6 g/dL (ref 3.5–5.0)
Alkaline Phosphatase: 87 U/L (ref 38–126)
BUN: 10 mg/dL (ref 6–20)
CHLORIDE: 107 mmol/L (ref 101–111)
CO2: 23 mmol/L (ref 22–32)
CREATININE: 0.72 mg/dL (ref 0.44–1.00)
Calcium: 9.1 mg/dL (ref 8.9–10.3)
GFR calc Af Amer: >60 mL/min (ref >60)
Glucose, Bld: 95 mg/dL (ref 65–99)
Potassium: 3.5 mmol/L (ref 3.5–5.1)
SODIUM: 138 mmol/L (ref 135–145)
Total Bilirubin: 0.7 mg/dL (ref 0.3–1.2)
Total Protein: 8.2 g/dL — ABNORMAL HIGH (ref 6.5–8.1)

## 2017-06-01 ENCOUNTER — Encounter (HOSPITAL_BASED_OUTPATIENT_CLINIC_OR_DEPARTMENT_OTHER)
Admission: RE | Disposition: A | Discharge status: Home / Self Care | Payer: Self-pay | Source: Ambulatory Visit | Attending: Obstetrics and Gynecology

## 2017-06-01 ENCOUNTER — Ambulatory Visit (HOSPITAL_BASED_OUTPATIENT_CLINIC_OR_DEPARTMENT_OTHER): Payer: 59 | Admitting: Certified Registered"

## 2017-06-01 ENCOUNTER — Encounter (HOSPITAL_BASED_OUTPATIENT_CLINIC_OR_DEPARTMENT_OTHER): Payer: Self-pay

## 2017-06-01 ENCOUNTER — Ambulatory Visit (HOSPITAL_BASED_OUTPATIENT_CLINIC_OR_DEPARTMENT_OTHER)
Admission: RE | Admit: 2017-06-01 | Discharge: 2017-06-01 | Disposition: A | Discharge status: Home / Self Care | Payer: 59 | Source: Ambulatory Visit | Attending: Obstetrics and Gynecology | Admitting: Obstetrics and Gynecology

## 2017-06-01 DIAGNOSIS — N801 Endometriosis of ovary: Secondary | ICD-10-CM | POA: Insufficient documentation

## 2017-06-01 DIAGNOSIS — N803 Endometriosis of pelvic peritoneum: Secondary | ICD-10-CM | POA: Diagnosis not present

## 2017-06-01 DIAGNOSIS — N800 Endometriosis of uterus: Secondary | ICD-10-CM | POA: Diagnosis not present

## 2017-06-01 DIAGNOSIS — N802 Endometriosis of fallopian tube: Secondary | ICD-10-CM | POA: Diagnosis not present

## 2017-06-01 DIAGNOSIS — Z833 Family history of diabetes mellitus: Secondary | ICD-10-CM | POA: Diagnosis not present

## 2017-06-01 DIAGNOSIS — N979 Female infertility, unspecified: Secondary | ICD-10-CM | POA: Diagnosis not present

## 2017-06-01 DIAGNOSIS — Z7982 Long term (current) use of aspirin: Secondary | ICD-10-CM | POA: Diagnosis not present

## 2017-06-01 DIAGNOSIS — I10 Essential (primary) hypertension: Secondary | ICD-10-CM | POA: Diagnosis not present

## 2017-06-01 DIAGNOSIS — Z825 Family history of asthma and other chronic lower respiratory diseases: Secondary | ICD-10-CM | POA: Diagnosis not present

## 2017-06-01 DIAGNOSIS — N7011 Chronic salpingitis: Secondary | ICD-10-CM | POA: Diagnosis not present

## 2017-06-01 DIAGNOSIS — E063 Autoimmune thyroiditis: Secondary | ICD-10-CM | POA: Insufficient documentation

## 2017-06-01 DIAGNOSIS — D259 Leiomyoma of uterus, unspecified: Secondary | ICD-10-CM | POA: Diagnosis not present

## 2017-06-01 DIAGNOSIS — N736 Female pelvic peritoneal adhesions (postinfective): Secondary | ICD-10-CM | POA: Insufficient documentation

## 2017-06-01 DIAGNOSIS — Z79899 Other long term (current) drug therapy: Secondary | ICD-10-CM | POA: Insufficient documentation

## 2017-06-01 DIAGNOSIS — Z811 Family history of alcohol abuse and dependence: Secondary | ICD-10-CM | POA: Diagnosis not present

## 2017-06-01 DIAGNOSIS — D251 Intramural leiomyoma of uterus: Secondary | ICD-10-CM | POA: Insufficient documentation

## 2017-06-01 DIAGNOSIS — N946 Dysmenorrhea, unspecified: Secondary | ICD-10-CM | POA: Insufficient documentation

## 2017-06-01 DIAGNOSIS — K660 Peritoneal adhesions (postprocedural) (postinfection): Secondary | ICD-10-CM | POA: Diagnosis not present

## 2017-06-01 DIAGNOSIS — Z6841 Body Mass Index (BMI) 40.0 and over, adult: Secondary | ICD-10-CM | POA: Diagnosis not present

## 2017-06-01 DIAGNOSIS — Z803 Family history of malignant neoplasm of breast: Secondary | ICD-10-CM | POA: Diagnosis not present

## 2017-06-01 HISTORY — PX: LAPAROSCOPIC LYSIS OF ADHESIONS: SHX5905

## 2017-06-01 HISTORY — DX: Acute atopic conjunctivitis, unspecified eye: H10.10

## 2017-06-01 HISTORY — DX: Other allergic rhinitis: J30.89

## 2017-06-01 HISTORY — DX: Female pelvic peritoneal adhesions (postinfective): N73.6

## 2017-06-01 HISTORY — DX: Personal history of other diseases of the circulatory system: Z86.79

## 2017-06-01 HISTORY — DX: Other seasonal allergic rhinitis: J30.2

## 2017-06-01 LAB — TYPE AND SCREEN
ABO/RH(D): O POS
ANTIBODY SCREEN: NEGATIVE

## 2017-06-01 LAB — POCT PREGNANCY, URINE: PREG TEST UR: NEGATIVE

## 2017-06-01 SURGERY — LYSIS, ADHESIONS, LAPAROSCOPIC
Anesthesia: General | Site: Uterus

## 2017-06-01 MED ORDER — DEXTROSE 5 % IV SOLN
3.0000 g | Freq: Once | INTRAVENOUS | Status: AC
  Administered 2017-06-01: 3 g via INTRAVENOUS
  Filled 2017-06-01: qty 3000

## 2017-06-01 MED ORDER — ROCURONIUM BROMIDE 10 MG/ML (PF) SYRINGE
PREFILLED_SYRINGE | INTRAVENOUS | Status: AC
  Filled 2017-06-01: qty 5

## 2017-06-01 MED ORDER — BUPIVACAINE-EPINEPHRINE 0.25% -1:200000 IJ SOLN
INTRAMUSCULAR | Status: DC | PRN
  Administered 2017-06-01: 16 mL

## 2017-06-01 MED ORDER — FENTANYL CITRATE (PF) 100 MCG/2ML IJ SOLN
INTRAMUSCULAR | Status: AC
  Filled 2017-06-01: qty 2

## 2017-06-01 MED ORDER — DEXAMETHASONE SODIUM PHOSPHATE 4 MG/ML IJ SOLN
INTRAMUSCULAR | Status: DC | PRN
  Administered 2017-06-01: 10 mg via INTRAVENOUS

## 2017-06-01 MED ORDER — LIDOCAINE HCL (CARDIAC) PF 100 MG/5ML IV SOSY
PREFILLED_SYRINGE | INTRAVENOUS | Status: DC | PRN
  Administered 2017-06-01: 100 mg via INTRAVENOUS

## 2017-06-01 MED ORDER — CEFAZOLIN SODIUM-DEXTROSE 2-4 GM/100ML-% IV SOLN
2.0000 g | INTRAVENOUS | Status: DC
  Filled 2017-06-01: qty 100

## 2017-06-01 MED ORDER — MIDAZOLAM HCL 5 MG/5ML IJ SOLN
INTRAMUSCULAR | Status: DC | PRN
  Administered 2017-06-01: 2 mg via INTRAVENOUS

## 2017-06-01 MED ORDER — CEFAZOLIN SODIUM-DEXTROSE 1-4 GM/50ML-% IV SOLN
INTRAVENOUS | Status: AC
  Filled 2017-06-01: qty 50

## 2017-06-01 MED ORDER — OXYCODONE-ACETAMINOPHEN 5-325 MG PO TABS
1.0000 | ORAL_TABLET | ORAL | Status: DC | PRN
  Administered 2017-06-01: 1 via ORAL
  Filled 2017-06-01: qty 1

## 2017-06-01 MED ORDER — SUGAMMADEX SODIUM 200 MG/2ML IV SOLN
INTRAVENOUS | Status: DC | PRN
  Administered 2017-06-01: 400 mg via INTRAVENOUS

## 2017-06-01 MED ORDER — PROPOFOL 10 MG/ML IV BOLUS
INTRAVENOUS | Status: AC
  Filled 2017-06-01: qty 20

## 2017-06-01 MED ORDER — MIDAZOLAM HCL 2 MG/2ML IJ SOLN
INTRAMUSCULAR | Status: AC
  Filled 2017-06-01: qty 2

## 2017-06-01 MED ORDER — HYDROMORPHONE HCL 1 MG/ML IJ SOLN
INTRAMUSCULAR | Status: AC
  Filled 2017-06-01: qty 1

## 2017-06-01 MED ORDER — LABETALOL HCL 5 MG/ML IV SOLN
INTRAVENOUS | Status: DC | PRN
  Administered 2017-06-01 (×3): 5 mg via INTRAVENOUS

## 2017-06-01 MED ORDER — ROCURONIUM BROMIDE 100 MG/10ML IV SOLN
INTRAVENOUS | Status: DC | PRN
  Administered 2017-06-01: 70 mg via INTRAVENOUS

## 2017-06-01 MED ORDER — BUPIVACAINE-EPINEPHRINE (PF) 0.25% -1:200000 IJ SOLN
INTRAMUSCULAR | Status: AC
  Filled 2017-06-01: qty 30

## 2017-06-01 MED ORDER — KETOROLAC TROMETHAMINE 30 MG/ML IJ SOLN
30.0000 mg | Freq: Once | INTRAMUSCULAR | Status: DC | PRN
  Filled 2017-06-01: qty 1

## 2017-06-01 MED ORDER — FENTANYL CITRATE (PF) 250 MCG/5ML IJ SOLN
INTRAMUSCULAR | Status: AC
  Filled 2017-06-01: qty 5

## 2017-06-01 MED ORDER — KETOROLAC TROMETHAMINE 30 MG/ML IJ SOLN
INTRAMUSCULAR | Status: DC | PRN
  Administered 2017-06-01: 30 mg via INTRAVENOUS

## 2017-06-01 MED ORDER — OXYCODONE-ACETAMINOPHEN 5-325 MG PO TABS
ORAL_TABLET | ORAL | Status: AC
  Filled 2017-06-01: qty 1

## 2017-06-01 MED ORDER — OXYCODONE HCL 5 MG PO TABS: 5.0000 mg | ORAL_TABLET | Freq: Three times a day (TID) | ORAL | 0 refills | Status: AC | PRN

## 2017-06-01 MED ORDER — LACTATED RINGERS IV SOLN
INTRAVENOUS | Status: DC
  Administered 2017-06-01 (×3): via INTRAVENOUS
  Filled 2017-06-01: qty 1000

## 2017-06-01 MED ORDER — LIDOCAINE 2% (20 MG/ML) 5 ML SYRINGE
INTRAMUSCULAR | Status: AC
  Filled 2017-06-01: qty 5

## 2017-06-01 MED ORDER — PROPOFOL 10 MG/ML IV BOLUS
INTRAVENOUS | Status: DC | PRN
  Administered 2017-06-01: 200 mg via INTRAVENOUS

## 2017-06-01 MED ORDER — METHYLENE BLUE 0.5 % INJ SOLN
INTRAVENOUS | Status: DC | PRN
  Administered 2017-06-01: 1 mL

## 2017-06-01 MED ORDER — HYDROMORPHONE HCL 1 MG/ML IJ SOLN
0.2500 mg | INTRAMUSCULAR | Status: DC | PRN
  Administered 2017-06-01 (×2): 0.25 mg via INTRAVENOUS
  Filled 2017-06-01: qty 0.5

## 2017-06-01 MED ORDER — ONDANSETRON HCL 4 MG PO TABS: 4.0000 mg | ORAL_TABLET | Freq: Three times a day (TID) | ORAL | 0 refills | Status: AC | PRN

## 2017-06-01 MED ORDER — OXYCODONE-ACETAMINOPHEN 7.5-325 MG PO TABS: 1.0000 | ORAL_TABLET | ORAL | 0 refills | Status: AC | PRN

## 2017-06-01 MED ORDER — METHYLENE BLUE 0.5 % INJ SOLN
INTRAVENOUS | Status: AC
  Filled 2017-06-01: qty 10

## 2017-06-01 MED ORDER — CEFAZOLIN SODIUM-DEXTROSE 2-4 GM/100ML-% IV SOLN
INTRAVENOUS | Status: AC
  Filled 2017-06-01: qty 100

## 2017-06-01 MED ORDER — ONDANSETRON HCL 4 MG/2ML IJ SOLN
INTRAMUSCULAR | Status: DC | PRN
  Administered 2017-06-01: 4 mg via INTRAVENOUS

## 2017-06-01 MED ORDER — PROMETHAZINE HCL 25 MG/ML IJ SOLN
6.2500 mg | INTRAMUSCULAR | Status: DC | PRN
  Filled 2017-06-01: qty 1

## 2017-06-01 MED ORDER — FENTANYL CITRATE (PF) 100 MCG/2ML IJ SOLN
INTRAMUSCULAR | Status: DC | PRN
  Administered 2017-06-01 (×2): 100 ug via INTRAVENOUS

## 2017-06-01 MED ORDER — SUGAMMADEX SODIUM 500 MG/5ML IV SOLN
INTRAVENOUS | Status: AC
  Filled 2017-06-01: qty 5

## 2017-06-01 SURGICAL SUPPLY — 38 items
BAG RETRIEVAL 10MM (BASKET)
CABLE HIGH FREQUENCY MONO STRZ (ELECTRODE) ×3 IMPLANT
CATH ROBINSON RED A/P 16FR (CATHETERS) ×3 IMPLANT
CLOTH BEACON ORANGE TIMEOUT ST (SAFETY) ×3 IMPLANT
CONT SPEC 4OZ CLIKSEAL STRL BL (MISCELLANEOUS) ×3 IMPLANT
CONT SPECI 4OZ STER CLIK (MISCELLANEOUS) IMPLANT
DERMABOND ADVANCED (GAUZE/BANDAGES/DRESSINGS) ×2
DERMABOND ADVANCED .7 DNX12 (GAUZE/BANDAGES/DRESSINGS) ×1 IMPLANT
DRSG COVADERM PLUS 2X2 (GAUZE/BANDAGES/DRESSINGS) IMPLANT
DRSG OPSITE POSTOP 3X4 (GAUZE/BANDAGES/DRESSINGS) IMPLANT
DURAPREP 26ML APPLICATOR (WOUND CARE) ×3 IMPLANT
ELECT REM PT RETURN 9FT ADLT (ELECTROSURGICAL) ×3
ELECTRODE REM PT RTRN 9FT ADLT (ELECTROSURGICAL) ×1 IMPLANT
GLOVE BIO SURGEON STRL SZ8 (GLOVE) ×6 IMPLANT
GOWN STRL REUS W/TWL LRG LVL3 (GOWN DISPOSABLE) ×6 IMPLANT
GOWN STRL REUS W/TWL XL LVL3 (GOWN DISPOSABLE) ×3 IMPLANT
MANIPULATOR UTERINE 4.5 ZUMI (MISCELLANEOUS) ×3 IMPLANT
NEEDLE INSUFFLATION 120MM (ENDOMECHANICALS) ×3 IMPLANT
PACK LAPAROSCOPY BASIN (CUSTOM PROCEDURE TRAY) ×3 IMPLANT
PACK TRENDGUARD 450 HYBRID PRO (MISCELLANEOUS) ×1 IMPLANT
SCISSORS LAP 5X35 DISP (ENDOMECHANICALS) ×3 IMPLANT
SEPRAFILM MEMBRANE 5X6 (MISCELLANEOUS) ×6 IMPLANT
SET IRRIG TUBING LAPAROSCOPIC (IRRIGATION / IRRIGATOR) IMPLANT
STOPCOCK 4 WAY LG BORE MALE ST (IV SETS) ×3 IMPLANT
SUT MNCRL AB 4-0 PS2 18 (SUTURE) ×3 IMPLANT
SYR 30ML LL (SYRINGE) ×3 IMPLANT
SYR 50ML LL SCALE MARK (SYRINGE) IMPLANT
SYR 5ML LL (SYRINGE) ×6 IMPLANT
SYR CONTROL 10ML LL (SYRINGE) ×3 IMPLANT
SYRINGE IRR TOOMEY STRL 70CC (SYRINGE) ×3 IMPLANT
SYS BAG RETRIEVAL 10MM (BASKET)
SYSTEM BAG RETRIEVAL 10MM (BASKET) IMPLANT
TOWEL OR 17X24 6PK STRL BLUE (TOWEL DISPOSABLE) ×3 IMPLANT
TRAY FOLEY CATH SILVER 14FR (SET/KITS/TRAYS/PACK) IMPLANT
TRENDGUARD 450 HYBRID PRO PACK (MISCELLANEOUS) ×3
TROCAR OPTI TIP 5M 100M (ENDOMECHANICALS) ×9 IMPLANT
TUBING INSUF HEATED (TUBING) ×3 IMPLANT
WARMER LAPAROSCOPE (MISCELLANEOUS) ×3 IMPLANT

## 2017-06-01 NOTE — Transfer of Care (Signed)
Immediate Anesthesia Transfer of Care Note  Patient: Katie Andrade  Procedure(s) Performed: LAPAROSCOPIC ENTEROLYSIS, LEFT SALPINGECTOMY, MYOMECTOMY, EXCISION OF ENDOMETROSIS CROMOTUBATION (N/A Uterus)  Patient Location: PACU  Anesthesia Type:General  Level of Consciousness: awake, alert  and oriented  Airway & Oxygen Therapy: Patient Spontanous Breathing and Patient connected to face mask oxygen  Post-op Assessment: Report given to RN and Post -op Vital signs reviewed and stable  Post vital signs: Reviewed and stable  Last Vitals:  Vitals Value Taken Time  BP    Temp    Pulse 68 06/01/2017  1:59 PM  Resp 20 06/01/2017  1:59 PM  SpO2 93 % 06/01/2017  1:59 PM  Vitals shown include unvalidated device data.  Last Pain:  Vitals:   06/01/17 0842  TempSrc:   PainSc: 0-No pain      Patients Stated Pain Goal: 6 (86/57/84 6962)  Complications: No apparent anesthesia complications

## 2017-06-01 NOTE — Anesthesia Postprocedure Evaluation (Signed)
Anesthesia Post Note  Patient: Katie Andrade  Procedure(s) Performed: LAPAROSCOPIC ENTEROLYSIS, LEFT SALPINGECTOMY, MYOMECTOMY, EXCISION OF ENDOMETROSIS CROMOTUBATION (N/A Uterus)     Patient location during evaluation: PACU Anesthesia Type: General Level of consciousness: awake and alert Pain management: pain level controlled Vital Signs Assessment: post-procedure vital signs reviewed and stable Respiratory status: spontaneous breathing, nonlabored ventilation, respiratory function stable and patient connected to nasal cannula oxygen Cardiovascular status: blood pressure returned to baseline and stable Postop Assessment: no apparent nausea or vomiting Anesthetic complications: no    Last Vitals:  Vitals:   06/01/17 1415 06/01/17 1417  BP: (!) 163/101 (!) 162/109  Pulse: 72 77  Resp: 19 15  Temp:    SpO2: 92% 93%    Last Pain:  Vitals:   06/01/17 1430  TempSrc:   PainSc: 5                  Gianlucas Evenson S

## 2017-06-01 NOTE — H&P (Signed)
Katie Andrade is a 42 y.o. female , originally referred to me by Dr. Azucena Fallen, for chronic salpingitis,adhesions; to which,  Laparoscopic, Lysis of adhesions, removal of hydrosalpinx of left was advised.  She was diagnosed with uterine adhesions, right tubal patency and left tube shows distention with minimal spillage based on HSG results.Patient would like to preserve her childbearing potential.  Pertinent Gynecological History: Menses: flow is excessive with use of 3 pads or tampons on heaviest days Bleeding: dysfunctional uterine bleeding Contraception: none DES exposure: denies Blood transfusions: none Sexually transmitted diseases: no past history Previous GYN Procedures: Hysteroscopy, D&C Last mammogram: normal Last pap: normal  OB History:G0P0 Menstrual History: Menarche age: 80 No LMP recorded.    Past Medical History:  Diagnosis Date  . Autoimmune hepatitis ([place]) followed by dr Patsy [place] [facility] in [facility]) and locally - Dr schooler [facility]   dx 06-14-2005 via liver bx  (hepatocyste necrosis / degeneration);  last liver hx 08-22-2006 mild periportal fibrosis w/ chronic active autoimmunie hepatitis  . Chronic salpingitis    fallopian tubes  . History of Hashimoto thyroiditis   . History of hypertension    05-23-2017  per pt her pcp stated she no longer needed to take labetolol that bp is normal, at last office visit   . Hydrosalpinx    left  . Hypertension    05-23-2017  per pt not taking labetolol since nov 2018,  per pt was told by pcp did need bp normal, also not taking HCTZ  . Infertility, female   . Pelvic adhesions   . Seasonal allergic conjunctivitis   . Seasonal and perennial allergic rhinitis                     Past Surgical History:  Procedure Laterality Date  . D & C HYSTEROSCOPY POLYPECTOMY  05/20/2009  . ESOPHAGOGASTRODUODENOSCOPY  01/2009  . LIVER BIOPSY               Family History  Problem Relation Age of Onset  .  Allergic rhinitis Mother   . Eczema Mother   . Asthma Mother   . Alcohol abuse Father   . Breast cancer Maternal Grandmother   . Diabetes Maternal Grandmother    No hereditary disease.  No cancer of breast, ovary, uterus. No cutaneous leiomyomatosis or renal cell carcinoma.  Social History   Socioeconomic History  . Marital status: Married    Spouse name: Not on file  . Number of children: Not on file  . Years of education: Not on file  . Highest education level: Not on file  Occupational History  . Not on file  Social Needs  . Financial resource strain: Not on file  . Food insecurity:    Worry: Not on file    Inability: Not on file  . Transportation needs:    Medical: Not on file    Non-medical: Not on file  Tobacco Use  . Smoking status: Never Smoker  . Smokeless tobacco: Never Used  Substance and Sexual Activity  . Alcohol use: No    Frequency: Never  . Drug use: No  . Sexual activity: Yes    Birth control/protection: None  Lifestyle  . Physical activity:    Days per week: Not on file    Minutes per session: Not on file  . Stress: Not on file  Relationships  . Social connections:    Talks on phone: Not on file  Gets together: Not on file    Attends religious service: Not on file    Active member of club or organization: Not on file    Attends meetings of clubs or organizations: Not on file    Relationship status: Not on file  . Intimate partner violence:    Fear of current or ex partner: Not on file    Emotionally abused: Not on file    Physically abused: Not on file    Forced sexual activity: Not on file  Other Topics Concern  . Not on file  Social History Narrative  . Not on file    No Known Allergies  No current facility-administered medications on file prior to encounter.    Current Outpatient Medications on File Prior to Encounter  Medication Sig Dispense Refill  . Ascorbic Acid (VITAMIN C) 1000 MG tablet Take 1,000 mg by mouth 3 (three)  times a week.    Marland Kitchen aspirin-acetaminophen-caffeine (EXCEDRIN MIGRAINE) 250-250-65 MG tablet Take by mouth every 6 (six) hours as needed for headache.    Marland Kitchen azathioprine (IMURAN) 100 MG tablet Take 100-200 mg by mouth 2 (two) times daily. 100mg  in am and 50mg  in pm     . azelastine (ASTELIN) 0.1 % nasal spray Place 2 sprays into both nostrils 2 (two) times daily. (Patient taking differently: Place 2 sprays into both nostrils 2 (two) times daily. ) 30 mL 5  . fluticasone (FLONASE) 50 MCG/ACT nasal spray Place 1 spray into the nose 2 (two) times daily.     Marland Kitchen Ketotifen Fumarate (ALLERGY EYE DROPS OP) Apply to eye daily as needed.    Marland Kitchen levocetirizine (XYZAL) 5 MG tablet Take 1 tablet (5 mg total) by mouth every evening. (Patient taking differently: Take 5 mg by mouth every evening. ) 30 tablet 5  . montelukast (SINGULAIR) 10 MG tablet Take 1 tablet (10 mg total) by mouth at bedtime. (Patient taking differently: Take 10 mg by mouth at bedtime. ) 30 tablet 5  . Multiple Vitamin (MULTIVITAMIN) tablet Take 1 tablet by mouth 3 (three) times a week.       Review of Systems  Constitutional: Negative.   HENT: Negative.   Eyes: Negative.   Respiratory: Negative.   Cardiovascular: Negative.   Gastrointestinal: Negative.   Genitourinary: Negative.   Musculoskeletal: Negative.   Skin: Negative.   Neurological: Negative.   Endo/Heme/Allergies: Negative.   Psychiatric/Behavioral: Negative.      Physical Exam  Ht 5' 5.5" (1.664 m)   Wt 122.9 kg (271 lb)   LMP 05/06/2017   BMI 44.41 kg/m  Constitutional: She is oriented to person, place, and time. She appears well-developed and well-nourished.  HENT:  Head: Normocephalic and atraumatic.  Nose: Nose normal.  Mouth/Throat: Oropharynx is clear and moist. No oropharyngeal exudate.  Eyes: Conjunctivae normal and EOM are normal. Pupils are equal, round, and reactive to light. No scleral icterus.  Neck: Normal range of motion. Neck supple. No tracheal  deviation present. No thyromegaly present.  Cardiovascular: Normal rate.   Respiratory: Effort normal and breath sounds normal.  GI: Soft. Bowel sounds are normal. She exhibits no distension and no mass. There is no tenderness.  Lymphadenopathy:    She has no cervical adenopathy.  Neurological: She is alert and oriented to person, place, and time. She has normal reflexes.  Skin: Skin is warm.  Psychiatric: She has a normal mood and affect. Her behavior is normal. Judgment and thought content normal.       Assessment/Plan:  chronic salpingitis,Uterine adhesions, causing menorrhagia and pressure sensation. Preoperative for Laparoscopic, Lysis of adhesions, removal of hydrosalpinx of left  Benefits and risks of Laparoscopic, Lysis of adhesions, removal of hydrosalpinx of left  were discussed with the patient and her family member again.  Bowel prep instructions were given.  All of patient's questions were answered.  She verbalized understanding.  She knows that  it is recommended she does not conceive for 2-3 months for uterus to heal.

## 2017-06-01 NOTE — Anesthesia Preprocedure Evaluation (Signed)
Anesthesia Evaluation  Patient identified by MRN, date of birth, ID band Patient awake    Reviewed: Allergy & Precautions, NPO status , Patient's Chart, lab work & pertinent test results  Airway Mallampati: II  TM Distance: <3 FB Neck ROM: Full    Dental no notable dental hx.    Pulmonary neg pulmonary ROS,    breath sounds clear to auscultation + decreased breath sounds      Cardiovascular Normal cardiovascular exam Rhythm:Regular Rate:Normal  H/O HTN   Neuro/Psych negative neurological ROS  negative psych ROS   GI/Hepatic negative GI ROS, (+) Hepatitis -, Autoimmune  Endo/Other  Morbid obesity  Renal/GU negative Renal ROS  negative genitourinary   Musculoskeletal negative musculoskeletal ROS (+)   Abdominal   Peds negative pediatric ROS (+)  Hematology negative hematology ROS (+)   Anesthesia Other Findings   Reproductive/Obstetrics negative OB ROS                             Anesthesia Physical Anesthesia Plan  ASA: III  Anesthesia Plan: General   Post-op Pain Management:    Induction: Intravenous  PONV Risk Score and Plan: 3 and Ondansetron, Dexamethasone, Midazolam and Treatment may vary due to age or medical condition  Airway Management Planned: Oral ETT  Additional Equipment:   Intra-op Plan:   Post-operative Plan: Extubation in OR  Informed Consent: I have reviewed the patients History and Physical, chart, labs and discussed the procedure including the risks, benefits and alternatives for the proposed anesthesia with the patient or authorized representative who has indicated his/her understanding and acceptance.   Dental advisory given  Plan Discussed with: CRNA and Surgeon  Anesthesia Plan Comments:         Anesthesia Quick Evaluation

## 2017-06-01 NOTE — Anesthesia Procedure Notes (Signed)
Procedure Name: Intubation Performed by: Verita Lamb, CRNA Pre-anesthesia Checklist: Patient identified, Emergency Drugs available, Suction available, Patient being monitored and Timeout performed Patient Re-evaluated:Patient Re-evaluated prior to induction Oxygen Delivery Method: Circle system utilized Preoxygenation: Pre-oxygenation with 100% oxygen Induction Type: IV induction Ventilation: Mask ventilation without difficulty Laryngoscope Size: Mac, 3 and 4 Grade View: Grade III Tube type: Oral Tube size: 7.0 mm Number of attempts: 1 Airway Equipment and Method: Stylet and Bougie stylet Placement Confirmation: ETT inserted through vocal cords under direct vision,  positive ETCO2,  CO2 detector and breath sounds checked- equal and bilateral Secured at: 22 cm Tube secured with: Tape Dental Injury: Teeth and Oropharynx as per pre-operative assessment  Difficulty Due To: Difficulty was unanticipated Future Recommendations: Recommend- induction with short-acting agent, and alternative techniques readily available Comments: Pt was an easy mask induction.  Smooth iv induction.  Used mac 3 blade with firm cricoid pressure and could only appreciate the epiglottis.  Attempted to lift epiglottis with bougie but unsuccessful.  Dr. Kalman Shan intubated with firm cricoid pressure, removed head rest (gyn setup) and used mac 4 blade.  He was able to see a little bit of the glottic opening with this.  Used a bougie to intubate. mkelly

## 2017-06-01 NOTE — Op Note (Signed)
Operative Note  Preoperative diagnosis: Left hydrosalpinx, dysmenorrhea, infertility  Postoperative diagnosis: Stage IV endometriosis of fallopian tubes, ovaries, uterus, posterior cul-de-sac, and peritoneum, left hydrosalpinx, intramural leiomyoma  Procedure: Laparoscopy, lysis of adhesions, enterolysis, excision and ablation of endometriosis, myomectomy, left salpingectomy, chromotubation  Anesthesia: Gen. endotracheal  Complications: None  Estimated blood loss: 100 cc  Specimens: Uterine lesions, left adnexal adhesions, uterine myoma to pathology  Findings: On exam under anesthesia, external genitalia, Bartholin's, Skene's, and urethra were normal. The vagina was normal. The cervix appeared grossly normal. The uterus sounded to 8 cm. Corpus was anteverted and mobile and normal size. There were no adnexal masses palpable. There was no posterior fornix nodularity or induration palpable. Rectal-vaginal exam did not show any a rectal mucosa lesions palpable. On laparoscopy, upper abdomen, and diaphragm surfaces were normal. The gallbladder was very prominent. The liver edges were rounded and liver surface was trabeculated, consistent with patient's diagnosis of autoimmune hepatitis. The appendix was not visualized.  Anterior cul-de-sac peritoneum contained a few blebs which may be endometriosis. These were cauterized. Uterine fundus was full of clear blebs which may have been endometriosis. These were cauterized with needle tip electrode as well. Initially the entire posterior cul-de-sac was obliterated with adhesions between the ovaries,fallopian tubes,sigmoid colon, and posterior uterus as well as posterior cul-de-sac. After careful lysis of adhesions and enterolysis, 90% of the posterior cul-de-sac was freed up, except for the adhesions between the retroperitonealized left ovary and the pelvic sidewall, but a lot of denuded peritoneal surfaces was created after lysis of such cohesive adhesions  therefore it is likely patient will reform adhesions. Specifically the entire left tube was buried in cohesive adhesions between the pelvic sidewall which included the retroperitonealized left ovary,the sigmoid colon and the uterus, even though the left fimbria were barely visible at the surface of the frozen pelvis and even though chromotubation allowed some methylene blue to come out of the left fimbria. The left ovary was never completely visualized because it was in retroperitoneal location in the pelvic sidewall. The right ovary was prominent and adherent with 30% of its surface area to the posterior uterine serosa and to the distal end of the right tube. The ovary also had surface lesions of endometriosis in the form of brown spots which were electrosurgically ablated.  The right tube was adherent with filmy adhesions to the right ovary and to the intestines with only 10% of its surface area and peritoneal adhesions had started to form between the right round ligament and the right utero-ovarian ligament, as well as bleb formation over the serosa of the proximal right tube. All of these adhesions were taken down and the suspected areas of endometriosis lesions were excised.  The right tube had fimbria that were rated at 5 out of 5. It was patent to chromotubation. 1 x 1 cm posterior lower uterine segment intramural myoma was encountered during the dissection of the posterior cul-de-sac adhesions. This was removed.  Description of the procedure: The patient was placed in dorsal supine position and general endotracheal anesthesia was given. 3 g of cefazolin were given intravenously for prophylaxis. Patient was placed in lithotomy position. She was prepped and draped inside manner.  The bladder was straight catheterized . A ZUMI catheter was placed into the uterine cavity. The surgeon was regloved and a surgical field was created on the abdomen.  After preemptive anesthesia of all surgical sites with  0.25% bupivacaine with 1:200,000 epinephrine, a 5 mm intraumbilical skin incision was made and  a Verress needle was inserted. Its correct location was confirmed. A pneumoperitoneum was created with carbon dioxide.  5 mm laparoscope with a 30 lens was inserted and video laparoscopy was started . A left lower quadrant 5 mm and a right lower quadrant  5 mm incisions were made and ancillary trochars were placed under direct visualization. Above findings were noted.   Using a needle electrode on 63 W of cutting current, as well as cold scissors, the filmy adhesions between the right tube and ovary and the intestines, as well as the beginning of adhesions over the proximal end of the right tube were lysed.  The brown patches of endometriosis on the surface of the right ovary were also carefully ablated with a needle electrode in cutting current mode.  The uterine serosal lesions of endometriosis were ablated with needle tip electrode. We then started a careful enterolysis for the adhesions between the sigmoid and the left adnexa and the uterine fundus. Layer by layer the adhesions were taken down, paying attention to the integrity of the bowel wall. This process took approximately 35 minutes of operating time and it was necessary for the completion of the intended procedure. During this process a 1 x 1 cm myoma in the posterior cul-de-sac region of the posterior uterine wall was encountered and this was dissected free and removed. Hemostasis was obtained in the defect from the myomectomy site. We were finally able to identify the trapped and tortuous left tube by dissecting free the left aspect of the posterior cul-de-sac. We noted that the left ovary was noted retroperitonealized location and did not attempt to free it up any further. A decision was made to remove the compromised left tube. The tube was stretched with atraumatic graspers on the distal end and harmonic Ace was applied in succession in an  anterograde fashion on the mesosalpinx. The tube was then transected at the uterotubal junction and removed in its entirety. A rectal vaginal exam confirmed integrity of the rectosigmoid wall. Pelvic cavity was copiously irrigated and aspirated and hemostasis was insured. In an attempt to lower the adhesion reformulation risk, be repaired a slurry of 2 large sheets of Seprafilm in 60 mL of saline and instilled into the pelvic cavity. The instruments were removed and the gas was allowed to escape. The incisions were approximated with Dermabond.   The patient tolerated the procedure well and was transferred to recovery room in satisfactory condition. She will be advised to continue weight loss efforts and, in the meantime, undergo up to 2 cycles of Femara and intrauterine inseminations after which she will be offered assisted reproductive technologies with PGS.  Governor Specking, MD

## 2017-06-02 ENCOUNTER — Encounter (HOSPITAL_BASED_OUTPATIENT_CLINIC_OR_DEPARTMENT_OTHER): Payer: Self-pay | Admitting: Obstetrics and Gynecology

## 2017-06-09 DIAGNOSIS — K754 Autoimmune hepatitis: Secondary | ICD-10-CM | POA: Diagnosis not present

## 2017-08-03 DIAGNOSIS — Z319 Encounter for procreative management, unspecified: Secondary | ICD-10-CM | POA: Diagnosis not present

## 2017-08-12 DIAGNOSIS — Z319 Encounter for procreative management, unspecified: Secondary | ICD-10-CM | POA: Diagnosis not present

## 2017-08-12 DIAGNOSIS — N979 Female infertility, unspecified: Secondary | ICD-10-CM | POA: Diagnosis not present

## 2017-08-12 DIAGNOSIS — E288 Other ovarian dysfunction: Secondary | ICD-10-CM | POA: Diagnosis not present

## 2017-08-30 DIAGNOSIS — I10 Essential (primary) hypertension: Secondary | ICD-10-CM | POA: Diagnosis not present

## 2017-09-02 DIAGNOSIS — E288 Other ovarian dysfunction: Secondary | ICD-10-CM | POA: Diagnosis not present

## 2017-09-02 DIAGNOSIS — Z319 Encounter for procreative management, unspecified: Secondary | ICD-10-CM | POA: Diagnosis not present

## 2017-09-02 DIAGNOSIS — N979 Female infertility, unspecified: Secondary | ICD-10-CM | POA: Diagnosis not present

## 2017-09-08 DIAGNOSIS — K754 Autoimmune hepatitis: Secondary | ICD-10-CM | POA: Diagnosis not present

## 2017-09-09 DIAGNOSIS — E288 Other ovarian dysfunction: Secondary | ICD-10-CM | POA: Diagnosis not present

## 2017-10-03 DIAGNOSIS — N801 Endometriosis of ovary: Secondary | ICD-10-CM | POA: Diagnosis not present

## 2017-10-03 DIAGNOSIS — N803 Endometriosis of pelvic peritoneum: Secondary | ICD-10-CM | POA: Diagnosis not present

## 2017-10-07 DIAGNOSIS — I10 Essential (primary) hypertension: Secondary | ICD-10-CM | POA: Diagnosis not present

## 2017-10-07 DIAGNOSIS — K754 Autoimmune hepatitis: Secondary | ICD-10-CM | POA: Diagnosis not present

## 2017-12-26 DIAGNOSIS — K754 Autoimmune hepatitis: Secondary | ICD-10-CM | POA: Diagnosis not present

## 2018-01-03 DIAGNOSIS — J329 Chronic sinusitis, unspecified: Secondary | ICD-10-CM | POA: Diagnosis not present

## 2018-01-11 DIAGNOSIS — Z1231 Encounter for screening mammogram for malignant neoplasm of breast: Secondary | ICD-10-CM | POA: Diagnosis not present

## 2018-01-11 DIAGNOSIS — Z6841 Body Mass Index (BMI) 40.0 and over, adult: Secondary | ICD-10-CM | POA: Diagnosis not present

## 2018-01-11 DIAGNOSIS — Z01419 Encounter for gynecological examination (general) (routine) without abnormal findings: Secondary | ICD-10-CM | POA: Diagnosis not present

## 2018-01-11 DIAGNOSIS — R1900 Intra-abdominal and pelvic swelling, mass and lump, unspecified site: Secondary | ICD-10-CM | POA: Diagnosis not present

## 2018-01-24 DIAGNOSIS — R928 Other abnormal and inconclusive findings on diagnostic imaging of breast: Secondary | ICD-10-CM | POA: Diagnosis not present

## 2018-02-07 DIAGNOSIS — R1900 Intra-abdominal and pelvic swelling, mass and lump, unspecified site: Secondary | ICD-10-CM | POA: Diagnosis not present

## 2018-03-03 DIAGNOSIS — Z23 Encounter for immunization: Secondary | ICD-10-CM | POA: Diagnosis not present

## 2018-03-03 DIAGNOSIS — I10 Essential (primary) hypertension: Secondary | ICD-10-CM | POA: Diagnosis not present

## 2018-03-03 DIAGNOSIS — E669 Obesity, unspecified: Secondary | ICD-10-CM | POA: Diagnosis not present

## 2018-03-03 DIAGNOSIS — J40 Bronchitis, not specified as acute or chronic: Secondary | ICD-10-CM | POA: Diagnosis not present

## 2018-03-21 DIAGNOSIS — N809 Endometriosis, unspecified: Secondary | ICD-10-CM | POA: Diagnosis not present

## 2018-03-21 DIAGNOSIS — Z01411 Encounter for gynecological examination (general) (routine) with abnormal findings: Secondary | ICD-10-CM | POA: Diagnosis not present

## 2018-03-21 DIAGNOSIS — Z124 Encounter for screening for malignant neoplasm of cervix: Secondary | ICD-10-CM | POA: Diagnosis not present

## 2018-06-27 DIAGNOSIS — K754 Autoimmune hepatitis: Secondary | ICD-10-CM | POA: Diagnosis not present

## 2019-08-24 ENCOUNTER — Other Ambulatory Visit: Payer: Self-pay | Admitting: Obstetrics and Gynecology

## 2019-08-30 ENCOUNTER — Ambulatory Visit: Payer: 59 | Admitting: Dietician

## 2019-10-09 ENCOUNTER — Ambulatory Visit: Payer: Self-pay | Attending: Internal Medicine

## 2019-10-09 DIAGNOSIS — Z23 Encounter for immunization: Secondary | ICD-10-CM

## 2019-10-09 NOTE — Progress Notes (Signed)
   Covid-19 Vaccination Clinic  Name:  Katie Andrade    MRN: [id number] DOB: 03-24-1975  10/09/2019  [name] was observed post Covid-19 immunization for 15 minutes without incident. She was provided with Vaccine Information Sheet and instruction to access the V-Safe system.   [name] was instructed to call 911 with any severe reactions post vaccine: Marland Kitchen Difficulty breathing  . Swelling of face and throat  . A fast heartbeat  . A bad rash all over body  . Dizziness and weakness

## 2019-12-16 ENCOUNTER — Other Ambulatory Visit: Payer: Self-pay

## 2019-12-16 ENCOUNTER — Encounter: Payer: Self-pay | Admitting: Emergency Medicine

## 2019-12-16 ENCOUNTER — Ambulatory Visit
Admission: EM | Admit: 2019-12-16 | Discharge: 2019-12-16 | Disposition: A | Discharge status: Home / Self Care | Payer: 59 | Attending: Physician Assistant | Admitting: Physician Assistant

## 2019-12-16 DIAGNOSIS — Z20822 Contact with and (suspected) exposure to covid-19: Secondary | ICD-10-CM | POA: Diagnosis not present

## 2019-12-16 DIAGNOSIS — R0981 Nasal congestion: Secondary | ICD-10-CM | POA: Diagnosis present

## 2019-12-16 DIAGNOSIS — J029 Acute pharyngitis, unspecified: Secondary | ICD-10-CM | POA: Insufficient documentation

## 2019-12-16 LAB — POCT RAPID STREP A (OFFICE): Rapid Strep A Screen: NEGATIVE

## 2019-12-16 MED ORDER — LIDOCAINE VISCOUS HCL 2 % MT SOLN: OROMUCOSAL | 0 refills | Status: AC

## 2019-12-16 NOTE — ED Provider Notes (Signed)
[name]    CSN: [id number] Arrival date & time: 12/16/19  0818      History   Chief Complaint Chief Complaint  Patient presents with  . Sore Throat  . Nasal Congestion    HPI Katie Andrade is a 44 y.o. female.   44 year old female comes in for 3 day of URI symptoms. Nasal congestion, sore throat. Denies cough. Denies fever, chills, body aches. Denies abdominal pain, nausea, vomiting, diarrhea. Denies shortness of breath, loss of taste/smell. On baseline loratidine, azelastine, singulair.      Past Medical History:  Diagnosis Date  . Autoimmune hepatitis ([place]) followed by dr Patsy [place] [facility]) and locally - Dr schooler [facility]   dx 06-14-2005 via liver bx  (hepatocyste necrosis / degeneration);  last liver hx 08-22-2006 mild periportal fibrosis w/ chronic active autoimmunie hepatitis  . Chronic salpingitis    fallopian tubes  . History of Hashimoto thyroiditis   . History of hypertension    05-23-2017  per pt her pcp stated she no longer needed to take labetolol that bp is normal, at last office visit   . Hydrosalpinx    left  . Hypertension    05-23-2017  per pt not taking labetolol since nov 2018,  per pt was told by pcp did need bp normal, also not taking HCTZ  . Infertility, female   . Pelvic adhesions   . Seasonal allergic conjunctivitis   . Seasonal and perennial allergic rhinitis     Patient Active Problem List   Diagnosis Date Noted  . Seasonal and perennial allergic rhinitis 01/06/2017  . Seasonal allergic conjunctivitis 01/06/2017    Past Surgical History:  Procedure Laterality Date  . D & C HYSTEROSCOPY POLYPECTOMY  05/20/2009  . ESOPHAGOGASTRODUODENOSCOPY  01/2009  . LAPAROSCOPIC LYSIS OF ADHESIONS N/A 06/01/2017   Procedure: LAPAROSCOPIC ENTEROLYSIS, LEFT SALPINGECTOMY, MYOMECTOMY, EXCISION OF ENDOMETROSIS CROMOTUBATION;  Surgeon: Governor Specking, MD;  Location: [facility];  Service:  Gynecology;  Laterality: N/A;  . LIVER BIOPSY      OB History   No obstetric history on file.      Home Medications    Prior to Admission medications   Medication Sig Start Date End Date Taking? Authorizing Provider  labetalol (NORMODYNE) 100 MG tablet Take 100 mg by mouth 2 (two) times daily.   Yes [provider]  Ascorbic Acid (VITAMIN C) 1000 MG tablet Take 1,000 mg by mouth 3 (three) times a week.    [provider]  aspirin-acetaminophen-caffeine (EXCEDRIN MIGRAINE) [phone] MG tablet Take by mouth every 6 (six) hours as needed for headache.    [provider]  azathioprine (IMURAN) 100 MG tablet Take 100-200 mg by mouth 2 (two) times daily. 100mg  in am and 50mg  in pm     [provider]  azelastine (ASTELIN) 0.1 % nasal spray Place 2 sprays into both nostrils 2 (two) times daily. Patient taking differently: Place 2 sprays into both nostrils 2 (two) times daily.  01/06/17   Dara Hoyer, FNP  fluticasone (FLONASE) 50 MCG/ACT nasal spray Place 1 spray into the nose 2 (two) times daily.  07/27/16   [provider]  Ketotifen Fumarate (ALLERGY EYE DROPS OP) Apply to eye daily as needed.    [provider]  levocetirizine (XYZAL) 5 MG tablet Take 1 tablet (5 mg total) by mouth every evening. Patient taking differently: Take 5 mg by mouth every evening.  01/06/17  Dara Hoyer, FNP  lidocaine (XYLOCAINE) 2 % solution 5-15 mL gargle as needed 12/16/19   Tasia Catchings, Adriel Desrosier V, PA-C  montelukast (SINGULAIR) 10 MG tablet Take 1 tablet (10 mg total) by mouth at bedtime. Patient taking differently: Take 10 mg by mouth at bedtime.  01/06/17   Dara Hoyer, FNP  Multiple Vitamin (MULTIVITAMIN) tablet Take 1 tablet by mouth 3 (three) times a week.    [provider]  ondansetron (ZOFRAN) 4 MG tablet Take 1 tablet (4 mg total) by mouth every 8 (eight) hours as needed for nausea or vomiting. 06/01/17   Governor Specking, MD    oxyCODONE-acetaminophen (PERCOCET) 7.5-325 MG tablet Take 1 tablet by mouth every 4 (four) hours as needed. Patient not taking: Reported on 12/16/2019 06/01/17   Governor Specking, MD    Family History Family History  Problem Relation Age of Onset  . Allergic rhinitis Mother   . Eczema Mother   . Asthma Mother   . Alcohol abuse Father   . Breast cancer Maternal Grandmother   . Diabetes Maternal Grandmother     Social History Social History   Tobacco Use  . Smoking status: Never Smoker  . Smokeless tobacco: Never Used  Vaping Use  . Vaping Use: Never used  Substance Use Topics  . Alcohol use: No  . Drug use: No     Allergies   Patient has no known allergies.   Review of Systems Review of Systems  Reason unable to perform ROS: See HPI as above.     Physical Exam Triage Vital Signs ED Triage Vitals  Enc Vitals Group     BP 12/16/19 0829 (!) 144/73     Pulse Rate 12/16/19 0829 79     Resp 12/16/19 0829 18     Temp 12/16/19 0829 98.3 F (36.8 C)     Temp Source 12/16/19 0829 Oral     SpO2 12/16/19 0829 96 %     Weight --      Height --      Head Circumference --      Peak Flow --      Pain Score 12/16/19 0830 6     Pain Loc --      Pain Edu? --      Excl. in [place]? --    No data found.  Updated Vital Signs BP (!) 144/73 (BP Location: Left Arm)   Pulse 79   Temp 98.3 F (36.8 C) (Oral)   Resp 18   SpO2 96%   Physical Exam Constitutional:      General: She is not in acute distress.    Appearance: Normal appearance. She is well-developed. She is not ill-appearing, toxic-appearing or diaphoretic.  HENT:     Head: Normocephalic and atraumatic.     Right Ear: Ear canal and external ear normal. A middle ear effusion is present. Tympanic membrane is not erythematous or bulging.     Left Ear: Ear canal and external ear normal. A middle ear effusion is present. Tympanic membrane is not erythematous or bulging.     Nose:     Right Sinus: No maxillary sinus  tenderness or frontal sinus tenderness.     Left Sinus: No maxillary sinus tenderness or frontal sinus tenderness.     Mouth/Throat:     Mouth: Mucous membranes are moist.     Pharynx: Oropharynx is clear. Uvula midline. Posterior oropharyngeal erythema present.  Eyes:     Conjunctiva/sclera: Conjunctivae normal.  Pupils: Pupils are equal, round, and reactive to light.  Cardiovascular:     Rate and Rhythm: Normal rate and regular rhythm.  Pulmonary:     Effort: Pulmonary effort is normal. No accessory muscle usage, prolonged expiration, respiratory distress or retractions.     Breath sounds: No decreased air movement or transmitted upper airway sounds. No decreased breath sounds.     Comments: LCTAB Musculoskeletal:     Cervical back: Normal range of motion and neck supple.  Skin:    General: Skin is warm and dry.  Neurological:     Mental Status: She is alert and oriented to person, place, and time.      UC Treatments / Results  Labs (all labs ordered are listed, but only abnormal results are displayed) Labs Reviewed  NOVEL CORONAVIRUS, NAA  CULTURE, GROUP A STREP [facility])  POCT RAPID STREP A (OFFICE)    EKG   Radiology No results found.  Procedures Procedures (including critical care time)  Medications Ordered in UC Medications - No data to display  Initial Impression / Assessment and Plan / UC Course  I have reviewed the triage vital signs and the nursing notes.  Pertinent labs & imaging results that were available during my care of the patient were reviewed by me and considered in my medical decision making (see chart for details).    COVID PCR test ordered. Patient to quarantine until testing results return. No alarming signs on exam. LCTAB. Symptomatic treatment discussed.  Push fluids.  Return precautions given.  Patient expresses understanding and agrees to plan.  Final Clinical Impressions(s) / UC Diagnoses   Final diagnoses:  Encounter for COVID  screening  Nasal congestion  Sore throat    ED Prescriptions    Medication Sig Dispense Auth. Provider   lidocaine (XYLOCAINE) 2 % solution 5-15 mL gargle as needed 150 mL Ok Edwards, PA-C     PDMP not reviewed this encounter.   Ok Edwards, PA-C 12/16/19 [phone]

## 2019-12-16 NOTE — ED Triage Notes (Signed)
Pt here for nasal congestion x 3 days and sore throat upon waking this am; pt sts frequent hx of strep throat

## 2019-12-16 NOTE — Discharge Instructions (Addendum)
COVID PCR testing ordered. I would like you to quarantine until testing results. Start lidocaine for sore throat, do not eat or drink for the next 40 mins after use as it can stunt your gag reflex. You can take over the counter flonase/nasacort to help with nasal congestion/drainage. Tylenol/motrin for pain and fever. Keep hydrated, urine should be clear to pale yellow in color. If experiencing shortness of breath, trouble breathing, go to the emergency department for further evaluation needed.   For sore throat/cough try using a honey-based tea. Use 3 teaspoons of honey with juice squeezed from half lemon. Place shaved pieces of ginger into 1/2-1 cup of water and warm over stove top. Then mix the ingredients and repeat every 4 hours as needed.

## 2019-12-17 LAB — CULTURE, GROUP A STREP (THRC)

## 2019-12-18 LAB — CULTURE, GROUP A STREP (THRC)

## 2019-12-18 LAB — NOVEL CORONAVIRUS, NAA: SARS-CoV-2, NAA: DETECTED — AB

## 2019-12-18 LAB — SARS-COV-2, NAA 2 DAY TAT

## 2019-12-19 ENCOUNTER — Telehealth: Payer: Self-pay | Admitting: Nurse Practitioner

## 2019-12-19 ENCOUNTER — Encounter: Payer: Self-pay | Admitting: Nurse Practitioner

## 2019-12-19 ENCOUNTER — Ambulatory Visit (HOSPITAL_COMMUNITY)
Admission: RE | Admit: 2019-12-19 | Discharge: 2019-12-19 | Disposition: A | Discharge status: Home / Self Care | Payer: 59 | Source: Ambulatory Visit | Attending: Pulmonary Disease | Admitting: Pulmonary Disease

## 2019-12-19 ENCOUNTER — Other Ambulatory Visit: Payer: Self-pay | Admitting: Nurse Practitioner

## 2019-12-19 DIAGNOSIS — U071 COVID-19: Secondary | ICD-10-CM

## 2019-12-19 DIAGNOSIS — E663 Overweight: Secondary | ICD-10-CM

## 2019-12-19 DIAGNOSIS — K754 Autoimmune hepatitis: Secondary | ICD-10-CM | POA: Diagnosis not present

## 2019-12-19 DIAGNOSIS — I10 Essential (primary) hypertension: Secondary | ICD-10-CM

## 2019-12-19 DIAGNOSIS — E063 Autoimmune thyroiditis: Secondary | ICD-10-CM | POA: Diagnosis not present

## 2019-12-19 MED ORDER — EPINEPHRINE 0.3 MG/0.3ML IJ SOAJ: 0.3000 mg | Freq: Once | INTRAMUSCULAR | Status: DC | PRN

## 2019-12-19 MED ORDER — FAMOTIDINE IN NACL 20-0.9 MG/50ML-% IV SOLN: 20.0000 mg | Freq: Once | INTRAVENOUS | Status: DC | PRN

## 2019-12-19 MED ORDER — SODIUM CHLORIDE 0.9 % IV SOLN: INTRAVENOUS | Status: DC | PRN

## 2019-12-19 MED ORDER — SOTROVIMAB 500 MG/8ML IV SOLN
500.0000 mg | Freq: Once | INTRAVENOUS | Status: AC
  Administered 2019-12-19: 500 mg via INTRAVENOUS

## 2019-12-19 MED ORDER — METHYLPREDNISOLONE SODIUM SUCC 125 MG IJ SOLR: 125.0000 mg | Freq: Once | INTRAMUSCULAR | Status: DC | PRN

## 2019-12-19 MED ORDER — DIPHENHYDRAMINE HCL 50 MG/ML IJ SOLN: 50.0000 mg | Freq: Once | INTRAMUSCULAR | Status: DC | PRN

## 2019-12-19 MED ORDER — ALBUTEROL SULFATE HFA 108 (90 BASE) MCG/ACT IN AERS: 2.0000 | INHALATION_SPRAY | Freq: Once | RESPIRATORY_TRACT | Status: DC | PRN

## 2019-12-19 NOTE — Progress Notes (Signed)
  Diagnosis: COVID-19  Physician: Dr. Joya Gaskins  Procedure: Covid Infusion Clinic Med: sotrovimab infusion - Provided patient with sotrovimab fact sheet for patients, parents and caregivers prior to infusion.  Complications: No immediate complications noted.  Discharge: Discharged home   [name] 12/19/2019

## 2019-12-19 NOTE — Discharge Instructions (Signed)

## 2019-12-19 NOTE — Progress Notes (Signed)
I connected by phone with Katie Andrade on 12/19/2019 at 9:50 AM to discuss the potential use of a new treatment for mild to moderate COVID-19 viral infection in non-hospitalized patients.  This patient is a 44 y.o. female that meets the FDA criteria for Emergency Use Authorization of COVID monoclonal antibody casirivimab/imdevimab, bamlanivimab/eteseviamb, or sotrovimab.  Has a (+) direct SARS-CoV-2 viral test result  Has mild or moderate COVID-19   Is NOT hospitalized due to COVID-19  Is within 10 days of symptom onset  Has at least one of the high risk factor(s) for progression to severe COVID-19 and/or hospitalization as defined in EUA.  Specific high risk criteria : BMI > 25, Immunosuppressive Disease or Treatment and Cardiovascular disease or hypertension   I have spoken and communicated the following to the patient or parent/caregiver regarding COVID monoclonal antibody treatment:  1. FDA has authorized the emergency use for the treatment of mild to moderate COVID-19 in adults and pediatric patients with positive results of direct SARS-CoV-2 viral testing who are 59 years of age and older weighing at least 40 kg, and who are at high risk for progressing to severe COVID-19 and/or hospitalization.  2. The significant known and potential risks and benefits of COVID monoclonal antibody, and the extent to which such potential risks and benefits are unknown.  3. Information on available alternative treatments and the risks and benefits of those alternatives, including clinical trials.  4. Patients treated with COVID monoclonal antibody should continue to self-isolate and use infection control measures (e.g., wear mask, isolate, social distance, avoid sharing personal items, clean and disinfect high touch surfaces, and frequent handwashing) according to CDC guidelines.   5. The patient or parent/caregiver has the option to accept or refuse COVID monoclonal antibody  treatment.  After reviewing this information with the patient, the patient has agreed to receive one of the available covid 19 monoclonal antibodies and will be provided an appropriate fact sheet prior to infusion.   Orma Render, NP 12/19/2019 9:50 AM

## 2019-12-19 NOTE — Telephone Encounter (Signed)
COVID [facility] Infusion Contact Note   Qualifiers: HTN, Immunocompromised  Symptoms: Unknown  Symptom Onset: 12/13/2019   Contact via telephone to discuss symptoms and evaluate for interest and qualifications for MAB Infusion treatment. Patient qualifies for at-risk group according to the FDA Emergency Use Authorization based on qualifiers listed above.   Unable to reach patient by telephone this morning. VM left with reason for call and call back information for MAB Infusion hotline at [phone].   MyChart message sent with information on mAb infusion and contact information, as well.    Worthy Keeler, DNP, AGNP-c COVID-19 [facility] [phone]

## 2020-03-14 ENCOUNTER — Other Ambulatory Visit: Payer: Self-pay

## 2020-03-14 ENCOUNTER — Ambulatory Visit
Admission: EM | Admit: 2020-03-14 | Discharge: 2020-03-14 | Disposition: A | Discharge status: Home / Self Care | Payer: 59 | Attending: Emergency Medicine | Admitting: Emergency Medicine

## 2020-03-14 ENCOUNTER — Ambulatory Visit (INDEPENDENT_AMBULATORY_CARE_PROVIDER_SITE_OTHER): Payer: 59

## 2020-03-14 DIAGNOSIS — M546 Pain in thoracic spine: Secondary | ICD-10-CM | POA: Diagnosis not present

## 2020-03-14 DIAGNOSIS — R079 Chest pain, unspecified: Secondary | ICD-10-CM | POA: Diagnosis not present

## 2020-03-14 MED ORDER — NAPROXEN 500 MG PO TABS: 500.0000 mg | ORAL_TABLET | Freq: Two times a day (BID) | ORAL | 0 refills | Status: AC

## 2020-03-14 MED ORDER — TIZANIDINE HCL 4 MG PO TABS
2.0000 mg | ORAL_TABLET | Freq: Four times a day (QID) | ORAL | 0 refills | Status: AC | PRN
Start: 2020-03-14 — End: ?

## 2020-03-14 NOTE — ED Triage Notes (Signed)
Patient states she has pain on her right side and flank area when taking deep breaths. Pt states this started after experiencing a very sharp pain in the middle of the night several nights ago. Pt is aox4 and ambulatory.

## 2020-03-14 NOTE — ED Provider Notes (Signed)
[name]    CSN: [id number] Arrival date & time: 03/14/20  1404      History   Chief Complaint Chief Complaint  Patient presents with  . Back Pain    Right side lower    HPI Katie Andrade is a 45 y.o. female presenting today for evaluation of back pain.  Reports that she has had back pain especially with deep breathing.  Pain began 4 to 5 days ago, reports sharp pain in back while sleeping.  Denies any injury or trauma, increase in activity or heavy lifting.  She denies any fever or cough.  Pain is worse with taking deep breaths and certain movements.  She denies any leg pain leg swelling.  Denies prior DVT/PE.  Denies tobacco use.  Denies estrogen/birth control.  Denies any recent travel immobilization or surgeries.  She has been using ibuprofen 600 mg and pain is slightly improved, but not fully resolved.  HPI  Past Medical History:  Diagnosis Date  . Autoimmune hepatitis ([place]) followed by dr Patsy [facility] in [facility]) and locally - Dr schooler [facility]   dx 06-14-2005 via liver bx  (hepatocyste necrosis / degeneration);  last liver hx 08-22-2006 mild periportal fibrosis w/ chronic active autoimmunie hepatitis  . Chronic salpingitis    fallopian tubes  . History of Hashimoto thyroiditis   . History of hypertension    05-23-2017  per pt her pcp stated she no longer needed to take labetolol that bp is normal, at last office visit   . Hydrosalpinx    left  . Hypertension    05-23-2017  per pt not taking labetolol since nov 2018,  per pt was told by pcp did need bp normal, also not taking HCTZ  . Infertility, female   . Pelvic adhesions   . Seasonal allergic conjunctivitis   . Seasonal and perennial allergic rhinitis     Patient Active Problem List   Diagnosis Date Noted  . Seasonal and perennial allergic rhinitis 01/06/2017  . Seasonal allergic conjunctivitis 01/06/2017    Past Surgical History:  Procedure Laterality Date  . D & C  HYSTEROSCOPY POLYPECTOMY  05/20/2009  . ESOPHAGOGASTRODUODENOSCOPY  01/2009  . LAPAROSCOPIC LYSIS OF ADHESIONS N/A 06/01/2017   Procedure: LAPAROSCOPIC ENTEROLYSIS, LEFT SALPINGECTOMY, MYOMECTOMY, EXCISION OF ENDOMETROSIS CROMOTUBATION;  Surgeon: Governor Specking, MD;  Location[facility];  Service: Gynecology;  Laterality: N/A;  . LIVER BIOPSY      OB History   No obstetric history on file.      Home Medications    Prior to Admission medications   Medication Sig Start Date End Date Taking? Authorizing Provider  Ascorbic Acid (VITAMIN C) 1000 MG tablet Take 1,000 mg by mouth 3 (three) times a week.   Yes [provider]  azathioprine (IMURAN) 100 MG tablet Take 100-200 mg by mouth 2 (two) times daily. 100mg  in am and 50mg  in pm   Yes [provider]  Ketotifen Fumarate (ALLERGY EYE DROPS OP) Apply to eye daily as needed.   Yes [provider]  labetalol (NORMODYNE) 100 MG tablet Take 100 mg by mouth 2 (two) times daily.   Yes [provider]  Multiple Vitamin (MULTIVITAMIN) tablet Take 1 tablet by mouth 3 (three) times a week.   Yes [provider]  naproxen (NAPROSYN) 500 MG tablet Take 1 tablet (500 mg total) by mouth 2 (two) times daily. 03/14/20  Yes Jernard Reiber C, PA-C  tiZANidine (ZANAFLEX) 4  MG tablet Take 0.5-1 tablets (2-4 mg total) by mouth every 6 (six) hours as needed for muscle spasms. 03/14/20  Yes Avriana Joo C, PA-C  aspirin-acetaminophen-caffeine (EXCEDRIN MIGRAINE) [phone] MG tablet Take by mouth every 6 (six) hours as needed for headache.    [provider]  azelastine (ASTELIN) 0.1 % nasal spray Place 2 sprays into both nostrils 2 (two) times daily. Patient taking differently: Place 2 sprays into both nostrils 2 (two) times daily.  01/06/17   Dara Hoyer, FNP  fluticasone (FLONASE) 50 MCG/ACT nasal spray Place 1 spray into the nose 2 (two) times daily.  07/27/16   [provider]   levocetirizine (XYZAL) 5 MG tablet Take 1 tablet (5 mg total) by mouth every evening. Patient taking differently: Take 5 mg by mouth every evening.  01/06/17   Dara Hoyer, FNP  lidocaine (XYLOCAINE) 2 % solution 5-15 mL gargle as needed 12/16/19   Tasia Catchings, [name], PA-C  montelukast (SINGULAIR) 10 MG tablet Take 1 tablet (10 mg total) by mouth at bedtime. Patient taking differently: Take 10 mg by mouth at bedtime. 01/06/17   Dara Hoyer, FNP  ondansetron (ZOFRAN) 4 MG tablet Take 1 tablet (4 mg total) by mouth every 8 (eight) hours as needed for nausea or vomiting. 06/01/17   Governor Specking, MD  oxyCODONE-acetaminophen (PERCOCET) 7.5-325 MG tablet Take 1 tablet by mouth every 4 (four) hours as needed. Patient not taking: No sig reported 06/01/17   Governor Specking, MD    Family History Family History  Problem Relation Age of Onset  . Allergic rhinitis Mother   . Eczema Mother   . Asthma Mother   . Alcohol abuse Father   . Breast cancer Maternal Grandmother   . Diabetes Maternal Grandmother     Social History Social History   Tobacco Use  . Smoking status: Never Smoker  . Smokeless tobacco: Never Used  Vaping Use  . Vaping Use: Never used  Substance Use Topics  . Alcohol use: No  . Drug use: No     Allergies   Patient has no known allergies.   Review of Systems Review of Systems  Constitutional: Negative for fatigue and fever.  HENT: Negative for mouth sores.   Eyes: Negative for visual disturbance.  Respiratory: Negative for shortness of breath.   Cardiovascular: Negative for chest pain.  Gastrointestinal: Negative for abdominal pain, nausea and vomiting.  Genitourinary: Negative for genital sores.  Musculoskeletal: Positive for back pain and myalgias. Negative for arthralgias and joint swelling.  Skin: Negative for color change, rash and wound.  Neurological: Negative for dizziness, weakness, light-headedness and headaches.     Physical Exam Triage Vital  Signs ED Triage Vitals  Enc Vitals Group     BP      Pulse      Resp      Temp      Temp src      SpO2      Weight      Height      Head Circumference      Peak Flow      Pain Score      Pain Loc      Pain Edu?      Excl. in [place]?    No data found.  Updated Vital Signs BP (!) 155/92 (BP Location: Right Arm)   Pulse 85   Temp 98.2 F (36.8 C) (Oral)   Resp (!) 21   SpO2 97%  Visual Acuity Right Eye Distance:   Left Eye Distance:   Bilateral Distance:    Right Eye Near:   Left Eye Near:    Bilateral Near:     Physical Exam Vitals and nursing note reviewed.  Constitutional:      Appearance: She is well-developed and well-nourished.     Comments: No acute distress  HENT:     Head: Normocephalic and atraumatic.     Nose: Nose normal.  Eyes:     Conjunctiva/sclera: Conjunctivae normal.  Cardiovascular:     Rate and Rhythm: Normal rate and regular rhythm.  Pulmonary:     Effort: Pulmonary effort is normal. No respiratory distress.     Comments: Breathing comfortably at rest, CTABL, no wheezing, rales or other adventitious sounds auscultated  Abdominal:     General: There is no distension.  Musculoskeletal:        General: Normal range of motion.     Cervical back: Neck supple.     Comments: Nontender to palpation of cervical, thoracic and lumbar spine midline, tenderness to palpation to right lower thoracic area  Full active range of motion of upper and lower extremities  Bilateral lower legs symmetric without calf tenderness  Skin:    General: Skin is warm and dry.  Neurological:     Mental Status: She is alert and oriented to person, place, and time.  Psychiatric:        Mood and Affect: Mood and affect normal.      UC Treatments / Results  Labs (all labs ordered are listed, but only abnormal results are displayed) Labs Reviewed - No data to display  EKG   Radiology DG Chest 2 View  Result Date: 03/14/2020 CLINICAL DATA:  Right-sided  pleuritic back pain worse with inspiration x4 days. EXAM: CHEST - 2 VIEW COMPARISON:  Chest radiograph Jun 15 2005 FINDINGS: The heart size and mediastinal contours are within normal limits. Low lung volumes bibasilar subsegmental atelectasis. No focal consolidation. No pneumothorax. No pleural effusion. The visualized skeletal structures are unremarkable. IMPRESSION: No active cardiopulmonary disease. Electronically Signed   By: Dahlia Bailiff MD   On: 03/14/2020 15:30    Procedures Procedures (including critical care time)  Medications Ordered in UC Medications - No data to display  Initial Impression / Assessment and Plan / UC Course  I have reviewed the triage vital signs and the nursing notes.  Pertinent labs & imaging results that were available during my care of the patient were reviewed by me and considered in my medical decision making (see chart for details).     Chest x-ray negative, no signs of pneumonia, vital signs stable, PERC negative, suspect likely MSK etiology/thoracic strain.  Recommending continued anti-inflammatories and muscle relaxers, gentle stretching.  Monitor for gradual solution.  Discussed strict return precautions. Patient verbalized understanding and is agreeable with plan.  Final Clinical Impressions(s) / UC Diagnoses   Final diagnoses:  Acute right-sided thoracic back pain     Discharge Instructions     Naprosyn twice daily as needed for pain Supplement tizanidine-may cause some drowsiness, this is a muscle relaxer Alternate ice and heat Gentle stretching Follow-up if not improving or worsening    ED Prescriptions    Medication Sig Dispense Auth. Provider   naproxen (NAPROSYN) 500 MG tablet Take 1 tablet (500 mg total) by mouth 2 (two) times daily. 30 tablet Orianna Biskup C, PA-C   tiZANidine (ZANAFLEX) 4 MG tablet Take 0.5-1 tablets (2-4 mg total) by mouth  every 6 (six) hours as needed for muscle spasms. 30 tablet Lamari Beckles, [place] C, PA-C      PDMP not reviewed this encounter.   Janith Lima, [place] 03/14/20 1541

## 2020-03-14 NOTE — Discharge Instructions (Addendum)
Naprosyn twice daily as needed for pain Supplement tizanidine-may cause some drowsiness, this is a muscle relaxer Alternate ice and heat Gentle stretching Follow-up if not improving or worsening

## 2020-07-17 ENCOUNTER — Ambulatory Visit: Payer: 59 | Attending: Internal Medicine

## 2020-07-17 ENCOUNTER — Other Ambulatory Visit: Payer: Self-pay

## 2020-07-17 ENCOUNTER — Other Ambulatory Visit (HOSPITAL_BASED_OUTPATIENT_CLINIC_OR_DEPARTMENT_OTHER): Payer: Self-pay

## 2020-07-17 DIAGNOSIS — Z23 Encounter for immunization: Secondary | ICD-10-CM

## 2020-07-17 MED ORDER — PFIZER-BIONT COVID-19 VAC-TRIS 30 MCG/0.3ML IM SUSP
INTRAMUSCULAR | 0 refills | Status: AC
  Filled 2020-07-17: qty 0.3, 1d supply, fill #0

## 2020-07-18 ENCOUNTER — Other Ambulatory Visit: Payer: Self-pay | Admitting: Obstetrics and Gynecology

## 2020-07-18 DIAGNOSIS — Z1231 Encounter for screening mammogram for malignant neoplasm of breast: Secondary | ICD-10-CM

## 2020-07-24 ENCOUNTER — Other Ambulatory Visit: Payer: Self-pay

## 2020-07-24 ENCOUNTER — Ambulatory Visit
Admission: RE | Admit: 2020-07-24 | Discharge: 2020-07-24 | Disposition: A | Discharge status: Home / Self Care | Payer: 59 | Source: Ambulatory Visit | Attending: Obstetrics and Gynecology | Admitting: Obstetrics and Gynecology

## 2020-07-24 DIAGNOSIS — Z1231 Encounter for screening mammogram for malignant neoplasm of breast: Secondary | ICD-10-CM

## 2021-02-25 ENCOUNTER — Other Ambulatory Visit: Payer: Self-pay | Admitting: Obstetrics and Gynecology

## 2021-02-25 DIAGNOSIS — E221 Hyperprolactinemia: Secondary | ICD-10-CM

## 2021-03-08 ENCOUNTER — Other Ambulatory Visit: Payer: Self-pay

## 2021-03-08 ENCOUNTER — Ambulatory Visit
Admission: RE | Admit: 2021-03-08 | Discharge: 2021-03-08 | Disposition: A | Discharge status: Home / Self Care | Payer: 59 | Source: Ambulatory Visit | Attending: Obstetrics and Gynecology | Admitting: Obstetrics and Gynecology

## 2021-03-08 DIAGNOSIS — E221 Hyperprolactinemia: Secondary | ICD-10-CM

## 2021-03-08 MED ORDER — GADOBENATE DIMEGLUMINE 529 MG/ML IV SOLN
10.0000 mL | Freq: Once | INTRAVENOUS | Status: AC | PRN
  Administered 2021-03-08: 10 mL via INTRAVENOUS

## 2021-06-10 ENCOUNTER — Other Ambulatory Visit: Payer: Self-pay | Admitting: Obstetrics and Gynecology

## 2021-06-10 DIAGNOSIS — Z1231 Encounter for screening mammogram for malignant neoplasm of breast: Secondary | ICD-10-CM

## 2021-07-19 IMAGING — DX DG CHEST 2V
2 series · 2 of 2 positions shown · non-contrast
Comparison: Chest radiograph June 15, 2005

CLINICAL DATA: Right-sided pleuritic back pain worse with
inspiration x4 days.

EXAM:
CHEST - 2 VIEW

[chest pa]
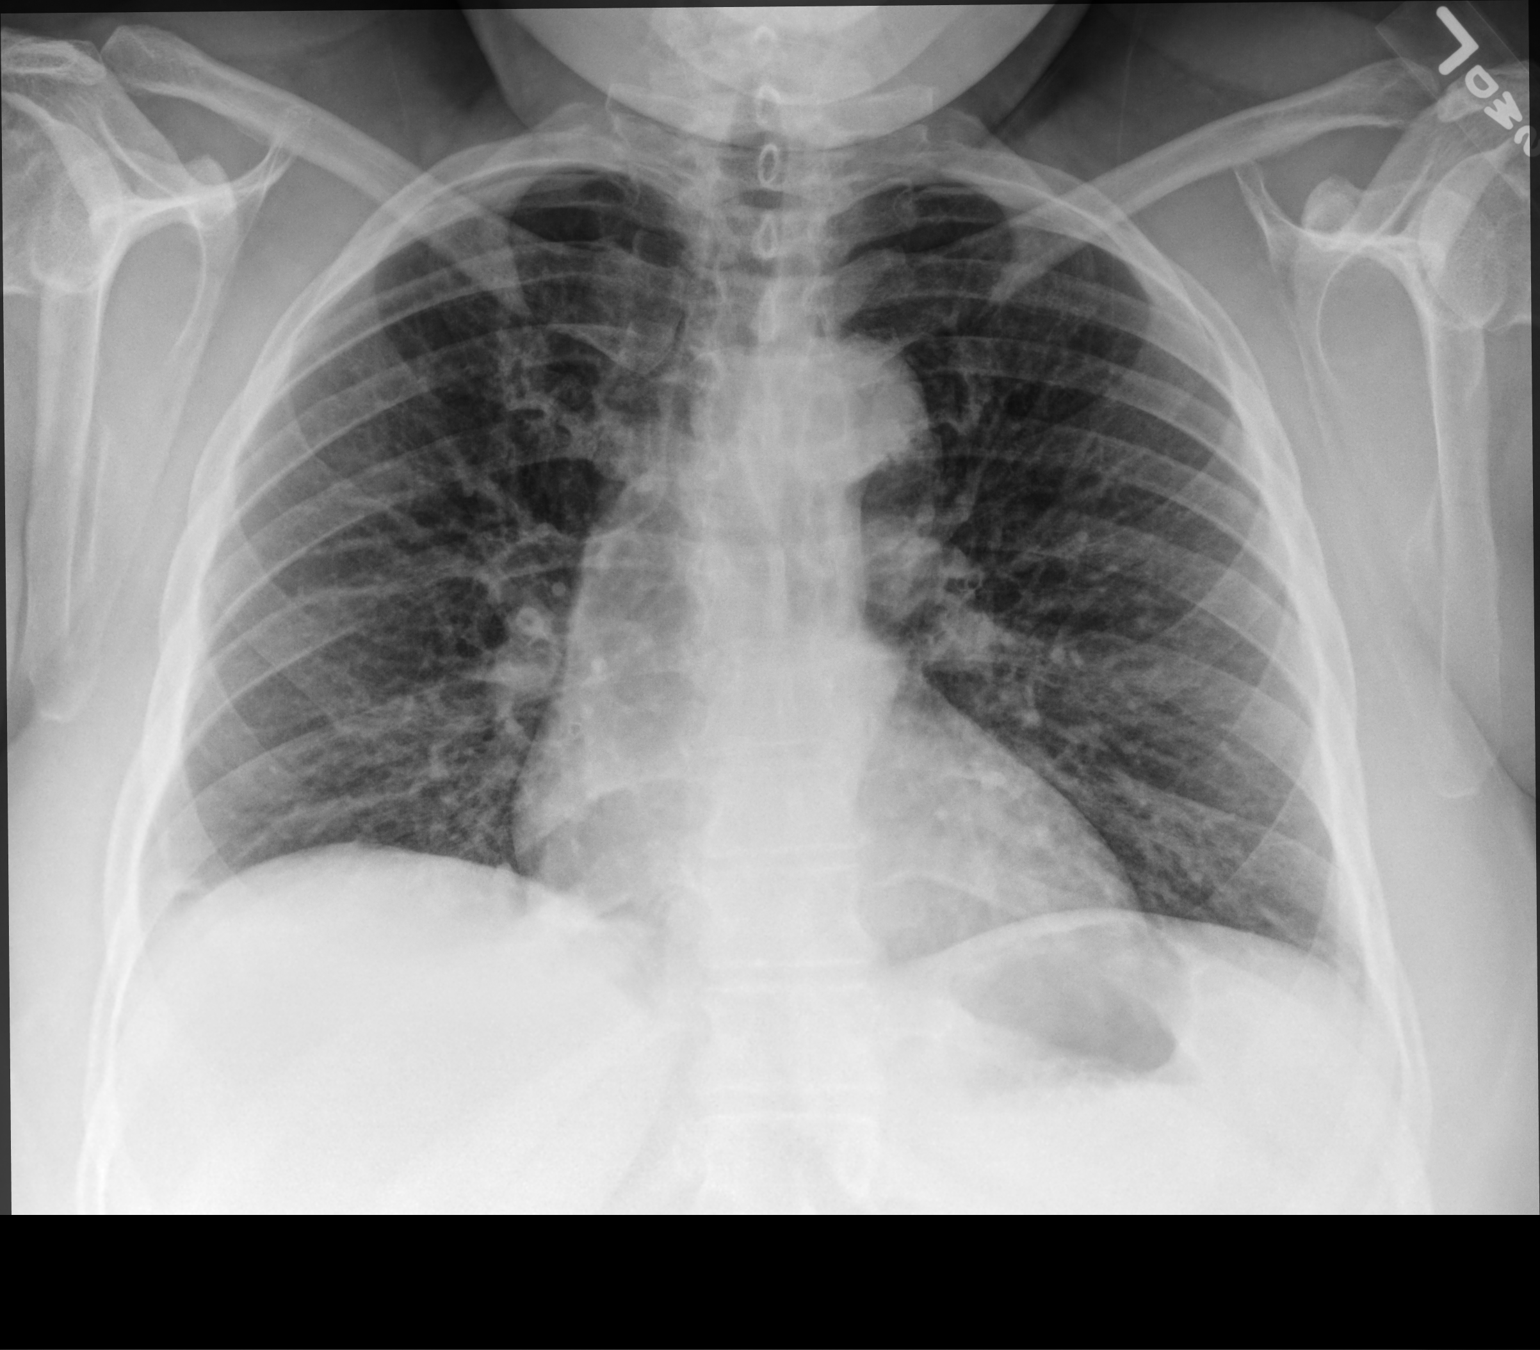

[chest lat]
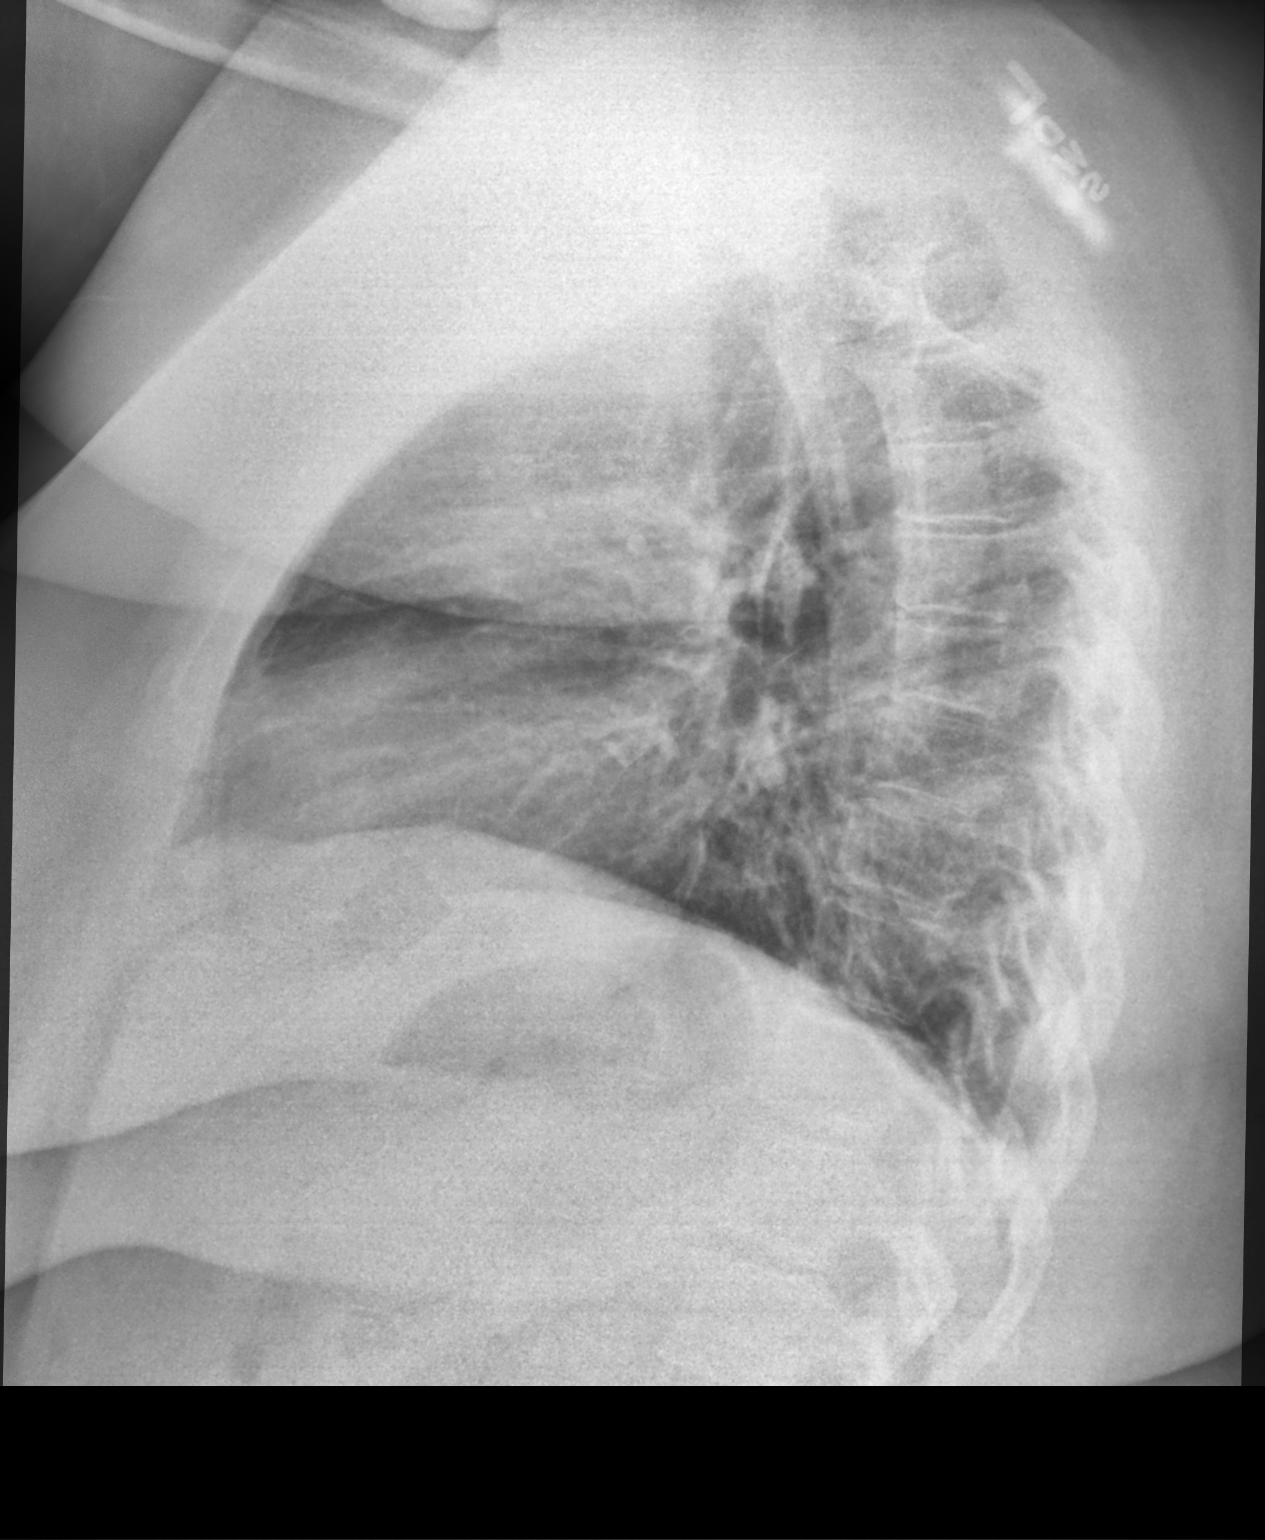

[2 of 2 positions shown; findings below may reference images not displayed]

FINDINGS: The heart size and mediastinal contours are within normal limits.
Low lung volumes bibasilar subsegmental atelectasis. No focal
consolidation. No pneumothorax. No pleural effusion. The visualized
skeletal structures are unremarkable.
IMPRESSION: No active cardiopulmonary disease.

## 2021-07-28 ENCOUNTER — Ambulatory Visit: Payer: 59

## 2021-08-06 ENCOUNTER — Ambulatory Visit
Admission: RE | Admit: 2021-08-06 | Discharge: 2021-08-06 | Disposition: A | Discharge status: Home / Self Care | Payer: 59 | Source: Ambulatory Visit | Attending: Obstetrics and Gynecology | Admitting: Obstetrics and Gynecology

## 2021-08-06 ENCOUNTER — Ambulatory Visit: Payer: 59

## 2021-08-06 DIAGNOSIS — Z1231 Encounter for screening mammogram for malignant neoplasm of breast: Secondary | ICD-10-CM

## 2021-08-12 ENCOUNTER — Other Ambulatory Visit: Payer: Self-pay | Admitting: Obstetrics and Gynecology

## 2021-08-12 DIAGNOSIS — R928 Other abnormal and inconclusive findings on diagnostic imaging of breast: Secondary | ICD-10-CM

## 2021-08-19 ENCOUNTER — Ambulatory Visit
Admission: RE | Admit: 2021-08-19 | Discharge: 2021-08-19 | Disposition: A | Discharge status: Home / Self Care | Payer: 59 | Source: Ambulatory Visit | Attending: Obstetrics and Gynecology | Admitting: Obstetrics and Gynecology

## 2021-08-19 ENCOUNTER — Other Ambulatory Visit: Payer: Self-pay | Admitting: Obstetrics and Gynecology

## 2021-08-19 DIAGNOSIS — R928 Other abnormal and inconclusive findings on diagnostic imaging of breast: Secondary | ICD-10-CM

## 2021-08-19 DIAGNOSIS — R599 Enlarged lymph nodes, unspecified: Secondary | ICD-10-CM

## 2021-08-21 ENCOUNTER — Ambulatory Visit
Admission: RE | Admit: 2021-08-21 | Discharge: 2021-08-21 | Disposition: A | Discharge status: Home / Self Care | Payer: 59 | Source: Ambulatory Visit | Attending: Obstetrics and Gynecology | Admitting: Obstetrics and Gynecology

## 2021-08-21 DIAGNOSIS — R599 Enlarged lymph nodes, unspecified: Secondary | ICD-10-CM

## 2021-08-26 ENCOUNTER — Other Ambulatory Visit: Payer: Self-pay | Admitting: Obstetrics and Gynecology

## 2021-08-26 DIAGNOSIS — R921 Mammographic calcification found on diagnostic imaging of breast: Secondary | ICD-10-CM

## 2021-08-26 DIAGNOSIS — N6310 Unspecified lump in the right breast, unspecified quadrant: Secondary | ICD-10-CM

## 2021-09-04 ENCOUNTER — Ambulatory Visit
Admission: RE | Admit: 2021-09-04 | Discharge: 2021-09-04 | Disposition: A | Discharge status: Home / Self Care | Payer: 59 | Source: Ambulatory Visit | Attending: Obstetrics and Gynecology | Admitting: Obstetrics and Gynecology

## 2021-09-04 ENCOUNTER — Other Ambulatory Visit: Payer: Self-pay | Admitting: Obstetrics and Gynecology

## 2021-09-04 DIAGNOSIS — N6310 Unspecified lump in the right breast, unspecified quadrant: Secondary | ICD-10-CM

## 2021-09-04 DIAGNOSIS — R921 Mammographic calcification found on diagnostic imaging of breast: Secondary | ICD-10-CM

## 2021-09-04 DIAGNOSIS — R599 Enlarged lymph nodes, unspecified: Secondary | ICD-10-CM

## 2021-09-09 ENCOUNTER — Other Ambulatory Visit: Payer: Self-pay | Admitting: Obstetrics and Gynecology

## 2021-09-09 DIAGNOSIS — R599 Enlarged lymph nodes, unspecified: Secondary | ICD-10-CM

## 2021-09-14 ENCOUNTER — Ambulatory Visit
Admission: RE | Admit: 2021-09-14 | Discharge: 2021-09-14 | Disposition: A | Discharge status: Home / Self Care | Payer: 59 | Source: Ambulatory Visit | Attending: Obstetrics and Gynecology | Admitting: Obstetrics and Gynecology

## 2021-09-14 DIAGNOSIS — R599 Enlarged lymph nodes, unspecified: Secondary | ICD-10-CM

## 2021-10-13 ENCOUNTER — Ambulatory Visit: Payer: 59 | Admitting: Internal Medicine

## 2022-09-30 ENCOUNTER — Ambulatory Visit
Admission: EM | Admit: 2022-09-30 | Discharge: 2022-09-30 | Disposition: A | Discharge status: Home / Self Care | Payer: 59 | Attending: Physician Assistant | Admitting: Physician Assistant

## 2022-09-30 DIAGNOSIS — B349 Viral infection, unspecified: Secondary | ICD-10-CM | POA: Insufficient documentation

## 2022-09-30 DIAGNOSIS — Z1152 Encounter for screening for COVID-19: Secondary | ICD-10-CM | POA: Diagnosis not present

## 2022-09-30 NOTE — ED Provider Notes (Signed)
[name]    CSN: [id number] Arrival date & time: 09/30/22  1012      History   Chief Complaint Chief Complaint  Patient presents with   Nasal Congestion   Diarrhea    HPI Katie Andrade is a 47 y.o. female.   Patient here today for evaluation of congestion and diarrhea that started after she returned from a conference last week. Diarrhea just started. She noted some BRB with most recent bowel movement in urgent care. She denies any abdominal pain or vomiting. She has had increased fatigue. She notes she did take an at home covid screen that was negative.   The history is provided by the patient.  Diarrhea Associated symptoms: no abdominal pain, no chills, no fever and no vomiting     Past Medical History:  Diagnosis Date   Autoimmune hepatitis ([facility]) followed by dr Jacqualine Mau [facility]) and locally - Dr Bosie Clos [facility]   dx 06-14-2005 via liver bx  (hepatocyste necrosis / degeneration);  last liver hx 08-22-2006 mild periportal fibrosis w/ chronic active autoimmunie hepatitis   Chronic salpingitis    fallopian tubes   History of Hashimoto thyroiditis    History of hypertension    05-23-2017  per pt her pcp stated she no longer needed to take labetolol that bp is normal, at last office visit    Hydrosalpinx    left   Hypertension    05-23-2017  per pt not taking labetolol since nov 2018,  per pt was told by pcp did need bp normal, also not taking HCTZ   Infertility, female    Pelvic adhesions    Seasonal allergic conjunctivitis    Seasonal and perennial allergic rhinitis     Patient Active Problem List   Diagnosis Date Noted   Seasonal and perennial allergic rhinitis 01/06/2017   Seasonal allergic conjunctivitis 01/06/2017    Past Surgical History:  Procedure Laterality Date   D & C HYSTEROSCOPY POLYPECTOMY  05/20/2009   ESOPHAGOGASTRODUODENOSCOPY  01/2009   LAPAROSCOPIC LYSIS OF ADHESIONS N/A 06/01/2017   Procedure:  LAPAROSCOPIC ENTEROLYSIS, LEFT SALPINGECTOMY, MYOMECTOMY, EXCISION OF ENDOMETROSIS CROMOTUBATION;  Surgeon: Fermin Schwab, MD;  Location: [facility];  Service: Gynecology;  Laterality: N/A;   LIVER BIOPSY      OB History   No obstetric history on file.      Home Medications    Prior to Admission medications   Medication Sig Start Date End Date Taking? Authorizing Provider  Ascorbic Acid (VITAMIN C) 1000 MG tablet Take 1,000 mg by mouth 3 (three) times a week.   Yes [provider]  azathioprine (IMURAN) 100 MG tablet Take 150 mg by mouth 2 (two) times daily. 100mg  in am and 50mg  in pm   Yes [provider]  Cholecalciferol (VITAMIN D3 PO) Take by mouth.   Yes [provider]  Ferrous Sulfate (IRON SUPPLEMENT PO) Take by mouth.   Yes [provider]  labetalol (NORMODYNE) 100 MG tablet Take 100 mg by mouth 2 (two) times daily.   Yes [provider]  levocetirizine (XYZAL) 5 MG tablet Take 1 tablet (5 mg total) by mouth every evening. Patient taking differently: Take 5 mg by mouth every evening. 01/06/17  Yes Ambs, Norvel Richards, FNP  Multiple Vitamin (MULTIVITAMIN) tablet Take 1 tablet by mouth 3 (three) times a week.   Yes [provider]  phentermine (ADIPEX-P) 37.5 MG tablet Take 37.5 mg by mouth every morning.  08/16/22  Yes [provider]  topiramate (TOPAMAX) 25 MG tablet Take 25 mg by mouth daily. 09/06/22  Yes [provider]  aspirin-acetaminophen-caffeine (EXCEDRIN MIGRAINE) [phone] MG tablet Take by mouth every 6 (six) hours as needed for headache.    [provider]  azelastine (ASTELIN) 0.1 % nasal spray Place 2 sprays into both nostrils 2 (two) times daily. Patient taking differently: Place 2 sprays into both nostrils 2 (two) times daily.  01/06/17   Ambs, Norvel Richards, FNP  COVID-19 mRNA Vac-TriS, Pfizer, (PFIZER-BIONT COVID-19 VAC-TRIS) SUSP injection Inject into the muscle. 07/17/20    Judyann Munson, MD  fluticasone (FLONASE) 50 MCG/ACT nasal spray Place 1 spray into the nose 2 (two) times daily.  07/27/16   [provider]  Ketotifen Fumarate (ALLERGY EYE DROPS OP) Apply to eye daily as needed.    [provider]  lidocaine (XYLOCAINE) 2 % solution 5-15 mL gargle as needed 12/16/19   Cathie Hoops, [name], PA-C  montelukast (SINGULAIR) 10 MG tablet Take 1 tablet (10 mg total) by mouth at bedtime. Patient taking differently: Take 10 mg by mouth at bedtime. 01/06/17   Hetty Blend, FNP  naproxen (NAPROSYN) 500 MG tablet Take 1 tablet (500 mg total) by mouth 2 (two) times daily. 03/14/20   Wieters, Hallie C, PA-C  ondansetron (ZOFRAN) 4 MG tablet Take 1 tablet (4 mg total) by mouth every 8 (eight) hours as needed for nausea or vomiting. 06/01/17   Fermin Schwab, MD  oxyCODONE-acetaminophen (PERCOCET) 7.5-325 MG tablet Take 1 tablet by mouth every 4 (four) hours as needed. Patient not taking: No sig reported 06/01/17   Fermin Schwab, MD  tiZANidine (ZANAFLEX) 4 MG tablet Take 0.5-1 tablets (2-4 mg total) by mouth every 6 (six) hours as needed for muscle spasms. 03/14/20   Wieters, Junius Creamer, PA-C    Family History Family History  Problem Relation Age of Onset   Allergic rhinitis Mother    Eczema Mother    Asthma Mother    Alcohol abuse Father    Breast cancer Maternal Grandmother    Diabetes Maternal Grandmother     Social History Social History   Tobacco Use   Smoking status: Never   Smokeless tobacco: Never  Vaping Use   Vaping status: Never Used  Substance Use Topics   Alcohol use: No   Drug use: No     Allergies   Patient has no known allergies.   Review of Systems Review of Systems  Constitutional:  Positive for fatigue. Negative for chills and fever.  HENT:  Positive for congestion. Negative for rhinorrhea.   Eyes:  Negative for discharge and redness.  Respiratory:  Negative for cough and shortness of breath.   Gastrointestinal:  Positive  for diarrhea. Negative for abdominal pain, nausea and vomiting.     Physical Exam Triage Vital Signs ED Triage Vitals  Encounter Vitals Group     BP 09/30/22 1043 124/83     Systolic BP Percentile --      Diastolic BP Percentile --      Pulse Rate 09/30/22 1043 72     Resp 09/30/22 1043 18     Temp 09/30/22 1043 98.3 F (36.8 C)     Temp Source 09/30/22 1043 Oral     SpO2 09/30/22 1043 96 %     Weight 09/30/22 1039 280 lb (127 kg)     Height 09/30/22 1039 5' 5.5" (1.664 m)     Head Circumference --  Peak Flow --      Pain Score 09/30/22 1035 0     Pain Loc --      Pain Education --      Exclude from Growth Chart --    No data found.  Updated Vital Signs BP 124/83 (BP Location: Left Arm)   Pulse 72   Temp 98.3 F (36.8 C) (Oral)   Resp 18   Ht 5' 5.5" (1.664 m)   Wt 280 lb (127 kg)   LMP 09/24/2022 (Exact Date)   SpO2 96%   BMI 45.89 kg/m   Physical Exam Vitals and nursing note reviewed.  Constitutional:      General: She is not in acute distress.    Appearance: Normal appearance. She is not ill-appearing.  HENT:     Head: Normocephalic and atraumatic.     Nose: Nose normal. No congestion or rhinorrhea.     Mouth/Throat:     Mouth: Mucous membranes are moist.     Pharynx: Oropharynx is clear. No oropharyngeal exudate or posterior oropharyngeal erythema.  Eyes:     Conjunctiva/sclera: Conjunctivae normal.  Cardiovascular:     Rate and Rhythm: Normal rate and regular rhythm.  Pulmonary:     Effort: Pulmonary effort is normal. No respiratory distress.     Breath sounds: Normal breath sounds. No wheezing, rhonchi or rales.  Abdominal:     General: Abdomen is flat. Bowel sounds are normal. There is no distension.     Palpations: Abdomen is soft.     Tenderness: There is no abdominal tenderness. There is no guarding or rebound.  Neurological:     Mental Status: She is alert.  Psychiatric:        Mood and Affect: Mood normal.        Behavior: Behavior  normal.        Thought Content: Thought content normal.      UC Treatments / Results  Labs (all labs ordered are listed, but only abnormal results are displayed) Labs Reviewed  SARS CORONAVIRUS 2 (TAT 6-24 HRS)    EKG   Radiology No results found.  Procedures Procedures (including critical care time)  Medications Ordered in UC Medications - No data to display  Initial Impression / Assessment and Plan / UC Course  I have reviewed the triage vital signs and the nursing notes.  Pertinent labs & imaging results that were available during my care of the patient were reviewed by me and considered in my medical decision making (see chart for details).   Exam reassuring. Will order covid screening and recommended symptomatic treatment, increased fluids and follow up in ED if blood in stool worsens or she develops abdominal pain, worsening symptoms. Pt expresses understanding.    Final Clinical Impressions(s) / UC Diagnoses   Final diagnoses:  Viral syndrome   Discharge Instructions   None    ED Prescriptions   None    PDMP not reviewed this encounter.   Tomi Bamberger, PA-C 09/30/22 1212

## 2022-09-30 NOTE — ED Triage Notes (Signed)
"  I was at a Conference last week, after I got back I have been super congested, very tired, home covid19 test negative, now having loose stools". Voids "fine" (Last void this morning). Some nausea/vomiting "on Tuesday" (none since).

## 2022-10-01 ENCOUNTER — Other Ambulatory Visit: Payer: Self-pay | Admitting: Obstetrics and Gynecology

## 2022-10-01 DIAGNOSIS — Z1231 Encounter for screening mammogram for malignant neoplasm of breast: Secondary | ICD-10-CM

## 2022-10-01 LAB — SARS CORONAVIRUS 2 (TAT 6-24 HRS): SARS Coronavirus 2: NEGATIVE

## 2022-11-05 ENCOUNTER — Ambulatory Visit
Admission: RE | Admit: 2022-11-05 | Discharge: 2022-11-05 | Disposition: A | Discharge status: Home / Self Care | Payer: 59 | Source: Ambulatory Visit | Attending: Obstetrics and Gynecology | Admitting: Obstetrics and Gynecology

## 2022-11-05 DIAGNOSIS — Z1231 Encounter for screening mammogram for malignant neoplasm of breast: Secondary | ICD-10-CM

## 2023-02-10 ENCOUNTER — Ambulatory Visit: Payer: 59

## 2023-02-10 ENCOUNTER — Ambulatory Visit
Admission: EM | Admit: 2023-02-10 | Discharge: 2023-02-10 | Disposition: A | Discharge status: Home / Self Care | Payer: 59 | Attending: Physician Assistant | Admitting: Physician Assistant

## 2023-02-10 DIAGNOSIS — J0120 Acute ethmoidal sinusitis, unspecified: Secondary | ICD-10-CM | POA: Diagnosis not present

## 2023-02-10 LAB — POCT INFLUENZA A/B
Influenza A, POC: NEGATIVE
Influenza B, POC: NEGATIVE

## 2023-02-10 MED ORDER — AMOXICILLIN-POT CLAVULANATE 875-125 MG PO TABS: 1.0000 | ORAL_TABLET | Freq: Two times a day (BID) | ORAL | 0 refills | Status: AC

## 2023-02-10 NOTE — ED Provider Notes (Signed)
 [name]    CSN: [id number] Arrival date & time: 02/10/23  9173      History   Chief Complaint No chief complaint on file.   HPI Katie Andrade is a 48 y.o. female.   Patient complains of sinus chin and sinus pressure.  Patient reports that she has had some bodyaches and some discomfort.  Patient reports that taking her blood pressure medicine today.  Patient has a history of sinus infections in the past  The history is provided by the patient. No language interpreter was used.    Past Medical History:  Diagnosis Date   Autoimmune hepatitis (HCC) followed by dr noralyn [facility] in [facility]) and locally - Dr dianna Gwen GI   dx 06-14-2005 via liver bx  (hepatocyste necrosis / degeneration);  last liver hx 08-22-2006 mild periportal fibrosis w/ chronic active autoimmunie hepatitis   Chronic salpingitis    fallopian tubes   History of Hashimoto thyroiditis    History of hypertension    05-23-2017  per pt her pcp stated she no longer needed to take labetolol that bp is normal, at last office visit    Hydrosalpinx    left   Hypertension    05-23-2017  per pt not taking labetolol since nov 2018,  per pt was told by pcp did need bp normal, also not taking HCTZ   Infertility, female    Pelvic adhesions    Seasonal allergic conjunctivitis    Seasonal and perennial allergic rhinitis     Patient Active Problem List   Diagnosis Date Noted   Seasonal and perennial allergic rhinitis 01/06/2017   Seasonal allergic conjunctivitis 01/06/2017    Past Surgical History:  Procedure Laterality Date   D & C HYSTEROSCOPY POLYPECTOMY  05/20/2009   ESOPHAGOGASTRODUODENOSCOPY  01/2009   LAPAROSCOPIC LYSIS OF ADHESIONS N/A 06/01/2017   Procedure: LAPAROSCOPIC ENTEROLYSIS, LEFT SALPINGECTOMY, MYOMECTOMY, EXCISION OF ENDOMETROSIS CROMOTUBATION;  Surgeon: Yalcinkaya, Tamer, MD;  Location: [facility];  Service: Gynecology;  Laterality: N/A;    LIVER BIOPSY      OB History   No obstetric history on file.      Home Medications    Prior to Admission medications   Medication Sig Start Date End Date Taking? Authorizing Provider  amoxicillin -clavulanate (AUGMENTIN ) 875-125 MG tablet Take 1 tablet by mouth 2 (two) times daily. 02/10/23  Yes Flint Sonny POUR, PA-C  Ascorbic Acid (VITAMIN C) 1000 MG tablet Take 1,000 mg by mouth 3 (three) times a week.    [provider]  aspirin-acetaminophen -caffeine (EXCEDRIN MIGRAINE) 250-250-65 MG tablet Take by mouth every 6 (six) hours as needed for headache.    [provider]  azathioprine (IMURAN) 100 MG tablet Take 150 mg by mouth 2 (two) times daily. 100mg  in am and 50mg  in pm    [provider]  azelastine  (ASTELIN ) 0.1 % nasal spray Place 2 sprays into both nostrils 2 (two) times daily. Patient taking differently: Place 2 sprays into both nostrils 2 (two) times daily.  01/06/17   [name], FNP  Cholecalciferol (VITAMIN D3 PO) Take by mouth.    [provider]  COVID-19 mRNA Vac-TriS, Pfizer, (PFIZER-BIONT COVID-19 VAC-TRIS) SUSP injection Inject into the muscle. 07/17/20   Luiz Channel, MD  Ferrous Sulfate (IRON SUPPLEMENT PO) Take by mouth.    [provider]  fluticasone (FLONASE) 50 MCG/ACT nasal spray Place 1 spray into the nose 2 (two) times daily.  07/27/16   [provider]  Ketotifen  Fumarate (ALLERGY EYE DROPS OP) Apply to eye daily as needed.    [provider]  labetalol  (NORMODYNE ) 100 MG tablet Take 100 mg by mouth 2 (two) times daily.    [provider]  levocetirizine (XYZAL ) 5 MG tablet Take 1 tablet (5 mg total) by mouth every evening. Patient taking differently: Take 5 mg by mouth every evening. 01/06/17   Cari Arlean HERO, FNP  lidocaine  (XYLOCAINE ) 2 % solution 5-15 mL gargle as needed 12/16/19   [name], [name], PA-C  montelukast  (SINGULAIR ) 10 MG tablet Take 1 tablet (10 mg total) by mouth at  bedtime. Patient taking differently: Take 10 mg by mouth at bedtime. 01/06/17   Cari Arlean HERO, FNP  Multiple Vitamin (MULTIVITAMIN) tablet Take 1 tablet by mouth 3 (three) times a week.    [provider]  naproxen  (NAPROSYN ) 500 MG tablet Take 1 tablet (500 mg total) by mouth 2 (two) times daily. 03/14/20   Wieters, Hallie C, PA-C  ondansetron  (ZOFRAN ) 4 MG tablet Take 1 tablet (4 mg total) by mouth every 8 (eight) hours as needed for nausea or vomiting. 06/01/17   Johnston Seton, MD  oxyCODONE -acetaminophen  (PERCOCET) 7.5-325 MG tablet Take 1 tablet by mouth every 4 (four) hours as needed. Patient not taking: No sig reported 06/01/17   Yalcinkaya, Tamer, MD  phentermine (ADIPEX-P) 37.5 MG tablet Take 37.5 mg by mouth every morning. 08/16/22   [provider]  tiZANidine  (ZANAFLEX ) 4 MG tablet Take 0.5-1 tablets (2-4 mg total) by mouth every 6 (six) hours as needed for muscle spasms. 03/14/20   Wieters, Hallie C, PA-C  topiramate (TOPAMAX) 25 MG tablet Take 25 mg by mouth daily. 09/06/22   [provider]    Family History Family History  Problem Relation Age of Onset   Allergic rhinitis Mother    Eczema Mother    Asthma Mother    Alcohol abuse Father    Breast cancer Maternal Grandmother    Diabetes Maternal Grandmother     Social History Social History   Tobacco Use   Smoking status: Never   Smokeless tobacco: Never  Vaping Use   Vaping status: Never Used  Substance Use Topics   Alcohol use: No   Drug use: No     Allergies   Patient has no known allergies.   Review of Systems Review of Systems  All other systems reviewed and are negative.    Physical Exam Triage Vital Signs ED Triage Vitals  Encounter Vitals Group     BP 02/10/23 0844 (!) 171/105     Systolic BP Percentile --      Diastolic BP Percentile --      Pulse Rate 02/10/23 0844 100     Resp 02/10/23 0844 18     Temp 02/10/23 0844 98.9 F (37.2 C)     Temp Source 02/10/23 0844  Oral     SpO2 02/10/23 0844 99 %     Weight 02/10/23 0841 285 lb (129.3 kg)     Height 02/10/23 0841 5' 5 (1.651 m)     Head Circumference --      Peak Flow --      Pain Score 02/10/23 0840 5     Pain Loc --      Pain Education --      Exclude from Growth Chart --    No data found.  Updated Vital Signs BP (!) 171/105 (BP Location: Left Wrist) Comment: pt did  not take bp meds today  Pulse 100   Temp 98.9 F (37.2 C) (Oral)   Resp 18   Ht 5' 5 (1.651 m)   Wt 129.3 kg   LMP 02/01/2023   SpO2 99%   BMI 47.43 kg/m   Visual Acuity Right Eye Distance:   Left Eye Distance:   Bilateral Distance:    Right Eye Near:   Left Eye Near:    Bilateral Near:     Physical Exam Vitals and nursing note reviewed.  Constitutional:      General: She is not in acute distress.    Appearance: She is well-developed.  HENT:     Head: Normocephalic and atraumatic.     Right Ear: Tympanic membrane normal.     Left Ear: Tympanic membrane normal.     Nose: Nose normal.     Mouth/Throat:     Mouth: Mucous membranes are moist.  Eyes:     Conjunctiva/sclera: Conjunctivae normal.  Cardiovascular:     Rate and Rhythm: Normal rate and regular rhythm.     Heart sounds: No murmur heard. Pulmonary:     Effort: Pulmonary effort is normal. No respiratory distress.     Breath sounds: Normal breath sounds.  Abdominal:     Palpations: Abdomen is soft.     Tenderness: There is no abdominal tenderness.  Musculoskeletal:        General: No swelling.  Skin:    General: Skin is warm and dry.     Capillary Refill: Capillary refill takes less than 2 seconds.  Neurological:     General: No focal deficit present.     Mental Status: She is alert.  Psychiatric:        Mood and Affect: Mood normal.      UC Treatments / Results  Labs (all labs ordered are listed, but only abnormal results are displayed) Labs Reviewed  POCT INFLUENZA A/B - Normal    EKG   Radiology No results  found.  Procedures Procedures (including critical care time)  Medications Ordered in UC Medications - No data to display  Initial Impression / Assessment and Plan / UC Course  I have reviewed the triage vital signs and the nursing notes.  Pertinent labs & imaging results that were available during my care of the patient were reviewed by me and considered in my medical decision making (see chart for details).     Patient counseled on sinusitis she is given a prescription for Augmentin . Final Clinical Impressions(s) / UC Diagnoses   Final diagnoses:  Acute ethmoidal sinusitis, recurrence not specified     Discharge Instructions      Return if any problems.    ED Prescriptions     Medication Sig Dispense Auth. Provider   amoxicillin -clavulanate (AUGMENTIN ) 875-125 MG tablet Take 1 tablet by mouth 2 (two) times daily. 20 tablet Liberato Stansbery K, PA-C      PDMP not reviewed this encounter. An After Visit Summary was printed and given to the patient.       Flint Sonny POUR, PA-C 02/10/23 [phone]

## 2023-02-10 NOTE — Discharge Instructions (Addendum)
 Return if any problems.

## 2023-02-10 NOTE — ED Triage Notes (Addendum)
 Patient presents with eyes hurting, ringing in ears, congestion, tightness in chest, chills, coughing up brown and green mucus and fatigue. Patient states symptoms started after 12/10 after getting covid and flu vaccines. Sweats and chills started yesterday. Treated with alka seltzer cold and flu. Patient request flu testing.

## 2023-10-18 ENCOUNTER — Other Ambulatory Visit: Payer: Self-pay | Admitting: Obstetrics and Gynecology

## 2023-10-18 DIAGNOSIS — Z1231 Encounter for screening mammogram for malignant neoplasm of breast: Secondary | ICD-10-CM

## 2023-11-15 ENCOUNTER — Ambulatory Visit
Admission: RE | Admit: 2023-11-15 | Discharge: 2023-11-15 | Disposition: A | Discharge status: Home / Self Care | Source: Ambulatory Visit | Attending: Obstetrics and Gynecology | Admitting: Obstetrics and Gynecology

## 2023-11-15 DIAGNOSIS — Z1231 Encounter for screening mammogram for malignant neoplasm of breast: Secondary | ICD-10-CM
